# Patient Record
Sex: Male | Born: 1945 | Race: White | Hispanic: No | Smoking: Former smoker
Health system: Southern US, Community
[De-identification: ages and names within clinical notes are randomized; demographics above are authoritative.]

## PROBLEM LIST (undated history)

## (undated) DIAGNOSIS — F209 Schizophrenia, unspecified: Secondary | ICD-10-CM

## (undated) DIAGNOSIS — Z9989 Dependence on other enabling machines and devices: Secondary | ICD-10-CM

## (undated) DIAGNOSIS — F32A Depression, unspecified: Secondary | ICD-10-CM

## (undated) DIAGNOSIS — I10 Essential (primary) hypertension: Secondary | ICD-10-CM

## (undated) DIAGNOSIS — I2721 Secondary pulmonary arterial hypertension: Secondary | ICD-10-CM

## (undated) DIAGNOSIS — F329 Major depressive disorder, single episode, unspecified: Secondary | ICD-10-CM

## (undated) DIAGNOSIS — Z8711 Personal history of peptic ulcer disease: Secondary | ICD-10-CM

## (undated) DIAGNOSIS — I5020 Unspecified systolic (congestive) heart failure: Secondary | ICD-10-CM

## (undated) DIAGNOSIS — J189 Pneumonia, unspecified organism: Secondary | ICD-10-CM

## (undated) DIAGNOSIS — J432 Centrilobular emphysema: Secondary | ICD-10-CM

## (undated) DIAGNOSIS — IMO0002 Reserved for concepts with insufficient information to code with codable children: Secondary | ICD-10-CM

## (undated) DIAGNOSIS — I255 Ischemic cardiomyopathy: Secondary | ICD-10-CM

## (undated) DIAGNOSIS — I2510 Atherosclerotic heart disease of native coronary artery without angina pectoris: Secondary | ICD-10-CM

## (undated) DIAGNOSIS — G4733 Obstructive sleep apnea (adult) (pediatric): Secondary | ICD-10-CM

## (undated) DIAGNOSIS — E7800 Pure hypercholesterolemia, unspecified: Secondary | ICD-10-CM

## (undated) DIAGNOSIS — J449 Chronic obstructive pulmonary disease, unspecified: Secondary | ICD-10-CM

## (undated) DIAGNOSIS — I700 Atherosclerosis of aorta: Secondary | ICD-10-CM

## (undated) DIAGNOSIS — F419 Anxiety disorder, unspecified: Secondary | ICD-10-CM

## (undated) DIAGNOSIS — Z8719 Personal history of other diseases of the digestive system: Secondary | ICD-10-CM

## (undated) DIAGNOSIS — K219 Gastro-esophageal reflux disease without esophagitis: Secondary | ICD-10-CM

## (undated) DIAGNOSIS — Z9861 Coronary angioplasty status: Principal | ICD-10-CM

## (undated) DIAGNOSIS — F319 Bipolar disorder, unspecified: Secondary | ICD-10-CM

## (undated) DIAGNOSIS — S06369A Traumatic hemorrhage of cerebrum, unspecified, with loss of consciousness of unspecified duration, initial encounter: Secondary | ICD-10-CM

## (undated) HISTORY — PX: HERNIA REPAIR: SHX51

## (undated) HISTORY — PX: CARDIAC CATHETERIZATION: SHX172

## (undated) HISTORY — DX: Dependence on other enabling machines and devices: Z99.89

## (undated) HISTORY — DX: Obstructive sleep apnea (adult) (pediatric): G47.33

## (undated) HISTORY — PX: APPENDECTOMY: SHX54

## (undated) HISTORY — DX: Atherosclerosis of aorta: I70.0

## (undated) HISTORY — DX: Coronary angioplasty status: Z98.61

## (undated) HISTORY — PX: HIATAL HERNIA REPAIR: SHX195

## (undated) HISTORY — DX: Atherosclerotic heart disease of native coronary artery without angina pectoris: I25.10

## (undated) HISTORY — DX: Centrilobular emphysema: J43.2

## (undated) HISTORY — DX: Chronic obstructive pulmonary disease, unspecified: J44.9

## (undated) HISTORY — DX: Traumatic hemorrhage of cerebrum, unspecified, with loss of consciousness of unspecified duration, initial encounter: S06.369A

## (undated) HISTORY — DX: Reserved for concepts with insufficient information to code with codable children: IMO0002

---

## 2015-03-23 DIAGNOSIS — S0636AA Traumatic hemorrhage of cerebrum, unspecified, with loss of consciousness status unknown, initial encounter: Secondary | ICD-10-CM

## 2015-03-23 DIAGNOSIS — S06369A Traumatic hemorrhage of cerebrum, unspecified, with loss of consciousness of unspecified duration, initial encounter: Secondary | ICD-10-CM

## 2015-03-23 HISTORY — DX: Traumatic hemorrhage of cerebrum, unspecified, with loss of consciousness status unknown, initial encounter: S06.36AA

## 2015-03-23 HISTORY — DX: Traumatic hemorrhage of cerebrum, unspecified, with loss of consciousness of unspecified duration, initial encounter: S06.369A

## 2015-10-11 ENCOUNTER — Ambulatory Visit: Payer: Self-pay | Admitting: Family Medicine

## 2015-10-12 ENCOUNTER — Ambulatory Visit (INDEPENDENT_AMBULATORY_CARE_PROVIDER_SITE_OTHER): Payer: Medicare (Managed Care) | Admitting: Physician Assistant

## 2015-10-12 VITALS — BP 124/63 | HR 73 | Ht 71.0 in | Wt 281.0 lb

## 2015-10-12 DIAGNOSIS — J4530 Mild persistent asthma, uncomplicated: Secondary | ICD-10-CM

## 2015-10-12 DIAGNOSIS — E669 Obesity, unspecified: Secondary | ICD-10-CM | POA: Diagnosis not present

## 2015-10-12 DIAGNOSIS — M7989 Other specified soft tissue disorders: Secondary | ICD-10-CM | POA: Diagnosis not present

## 2015-10-12 DIAGNOSIS — G479 Sleep disorder, unspecified: Secondary | ICD-10-CM

## 2015-10-12 DIAGNOSIS — F251 Schizoaffective disorder, depressive type: Secondary | ICD-10-CM | POA: Diagnosis not present

## 2015-10-12 DIAGNOSIS — I2510 Atherosclerotic heart disease of native coronary artery without angina pectoris: Secondary | ICD-10-CM

## 2015-10-12 DIAGNOSIS — R0683 Snoring: Secondary | ICD-10-CM

## 2015-10-12 DIAGNOSIS — R0600 Dyspnea, unspecified: Secondary | ICD-10-CM

## 2015-10-12 MED ORDER — ALBUTEROL SULFATE HFA 108 (90 BASE) MCG/ACT IN AERS: 2.0000 | INHALATION_SPRAY | Freq: Four times a day (QID) | RESPIRATORY_TRACT | 2 refills | Status: AC | PRN

## 2015-10-12 MED ORDER — FLUTICASONE FUROATE-VILANTEROL 100-25 MCG/INH IN AEPB: 1.0000 | INHALATION_SPRAY | Freq: Every day | RESPIRATORY_TRACT | 5 refills | Status: DC

## 2015-10-12 NOTE — Progress Notes (Signed)
   Subjective:    Patient ID: Ryan Berg, male    DOB: 04/03/1945, 70 y.o.   MRN: [id number]  HPI  Pt is a 70 yo male new patient with schizoaffective disorder and asthma who presents to the clinic to establish care. He just moved here to live with sister from [place]. He just [facility] where all his meds were changed. We don't have any notes to know what was done. He is having problems snoring and feeling rested. He complains of some shortness of breath more with exertion but sometimes even at rest. He admits he has had some recent wheezing as well and does not have a rescue inhaler for asthma. He is taking singulair but not on any daily inhaler.   .. Active Ambulatory Problems    Diagnosis Date Noted  . Schizoaffective disorder, depressive type (HCC) 10/12/2015  . Swelling of limb 10/14/2015  . Asthma, mild persistent 10/14/2015  . Snoring 10/14/2015  . Obesity 10/14/2015  . Sleeping difficulties 10/14/2015  . Coronary artery disease involving native coronary artery of native heart without angina pectoris 10/14/2015   Resolved Ambulatory Problems    Diagnosis Date Noted  . No Resolved Ambulatory Problems   No Additional Past Medical History   .Marland Kitchen. Social History   Social History  . Marital status: Unknown    Spouse name: N/A  . Number of children: N/A  . Years of education: N/A   Occupational History  . Not on file.   Social History Main Topics  . Smoking status: Never Smoker  . Smokeless tobacco: Not on file  . Alcohol use Not on file  . Drug use: Unknown  . Sexual activity: Not on file   Other Topics Concern  . Not on file   Social History Narrative  . No narrative on file      Review of Systems See HPI>     Objective:   Physical Exam  Constitutional: He is oriented to person, place, and time. He appears well-developed and well-nourished.  Obese.   HENT:  Head: Normocephalic and atraumatic.  Cardiovascular: Normal rate,  regular rhythm and normal heart sounds.   Pulmonary/Chest: Effort normal. He has wheezes.  Scattered expiratory wheezing of both lungs.   Neurological: He is alert and oriented to person, place, and time.  Skin: Skin is dry.  Diffuse mild swelling in bilateral hands and fingers.  1+ pitting edema in bilateral ankles.   Psychiatric: He has a normal mood and affect. His behavior is normal.          Assessment & Plan:  [facility]   Snoring/sleeping difficulties/obesity- will order sleep study. Continue on remeron.  Recent weight gain medication side effect vs water retention.   Swelling of extremities- will check sodium since hx of sodium tablets. Will consider a diuretic if sodium and potassium normal. Certainly need to consider low salt diet and if there could be any medication reaction.  Will order echo to look at heart and rule out CHF especially with CAD risk factors.   CAD-discussed with patient to go back on plavix. Continue on BB.   Asthma- continue on singulair, albuterol given for rescue, BREO started. Discussed side effects. Discussed how to use. FOLLow up in 1 month.   Spent 30 minutes going over medications that he brings in.

## 2015-10-12 NOTE — Patient Instructions (Addendum)
Follow up after sleep study?

## 2015-10-13 LAB — BASIC METABOLIC PANEL WITH GFR
BUN: 17 mg/dL (ref 7–25)
CHLORIDE: 104 mmol/L (ref 98–110)
CO2: 24 mmol/L (ref 20–31)
Calcium: 8.8 mg/dL (ref 8.6–10.3)
Creat: 0.74 mg/dL (ref 0.70–1.25)
Glucose, Bld: 105 mg/dL — ABNORMAL HIGH (ref 65–99)
POTASSIUM: 4.4 mmol/L (ref 3.5–5.3)
Sodium: 139 mmol/L (ref 135–146)

## 2015-10-14 ENCOUNTER — Encounter: Payer: Self-pay | Admitting: Physician Assistant

## 2015-10-14 DIAGNOSIS — M7989 Other specified soft tissue disorders: Secondary | ICD-10-CM | POA: Insufficient documentation

## 2015-10-14 DIAGNOSIS — R0683 Snoring: Secondary | ICD-10-CM | POA: Insufficient documentation

## 2015-10-14 DIAGNOSIS — I2510 Atherosclerotic heart disease of native coronary artery without angina pectoris: Secondary | ICD-10-CM | POA: Insufficient documentation

## 2015-10-14 DIAGNOSIS — G479 Sleep disorder, unspecified: Secondary | ICD-10-CM | POA: Insufficient documentation

## 2015-10-14 DIAGNOSIS — J4530 Mild persistent asthma, uncomplicated: Secondary | ICD-10-CM | POA: Insufficient documentation

## 2015-10-14 DIAGNOSIS — E669 Obesity, unspecified: Secondary | ICD-10-CM | POA: Insufficient documentation

## 2015-10-14 MED ORDER — HYDROCHLOROTHIAZIDE 12.5 MG PO TABS: 12.5000 mg | ORAL_TABLET | Freq: Every day | ORAL | 3 refills | Status: DC

## 2015-10-16 ENCOUNTER — Encounter: Payer: Self-pay | Admitting: Physician Assistant

## 2015-10-17 DIAGNOSIS — R0600 Dyspnea, unspecified: Secondary | ICD-10-CM | POA: Insufficient documentation

## 2015-10-17 NOTE — Progress Notes (Signed)
HCTZ sent. Echo ordered.

## 2015-10-18 ENCOUNTER — Ambulatory Visit: Payer: Self-pay | Admitting: Family Medicine

## 2015-10-19 ENCOUNTER — Other Ambulatory Visit: Payer: Self-pay | Admitting: *Deleted

## 2015-10-19 MED ORDER — METOPROLOL TARTRATE 25 MG PO TABS: 25.0000 mg | ORAL_TABLET | Freq: Two times a day (BID) | ORAL | 3 refills | Status: DC

## 2015-10-20 ENCOUNTER — Telehealth: Payer: Self-pay

## 2015-10-20 NOTE — Telephone Encounter (Signed)
Ryan Berg is taking the metoprolol 25 mg 1/2 tablet bid. Patient advised to continue taking as he has in the past.

## 2015-11-04 ENCOUNTER — Other Ambulatory Visit: Payer: Self-pay | Admitting: *Deleted

## 2015-11-04 MED ORDER — MONTELUKAST SODIUM 10 MG PO TABS: 10.0000 mg | ORAL_TABLET | Freq: Every day | ORAL | 5 refills | Status: DC

## 2015-11-07 ENCOUNTER — Other Ambulatory Visit: Payer: Self-pay | Admitting: *Deleted

## 2015-11-07 MED ORDER — ATORVASTATIN CALCIUM 10 MG PO TABS: 10.0000 mg | ORAL_TABLET | Freq: Every day | ORAL | 3 refills | Status: DC

## 2015-11-09 DIAGNOSIS — F251 Schizoaffective disorder, depressive type: Secondary | ICD-10-CM | POA: Diagnosis not present

## 2015-11-10 ENCOUNTER — Ambulatory Visit (HOSPITAL_BASED_OUTPATIENT_CLINIC_OR_DEPARTMENT_OTHER): Payer: PPO | Attending: Physician Assistant | Admitting: Internal Medicine

## 2015-11-10 VITALS — Ht 71.0 in | Wt 281.0 lb

## 2015-11-10 DIAGNOSIS — G479 Sleep disorder, unspecified: Secondary | ICD-10-CM | POA: Insufficient documentation

## 2015-11-10 DIAGNOSIS — G4733 Obstructive sleep apnea (adult) (pediatric): Secondary | ICD-10-CM | POA: Diagnosis not present

## 2015-11-10 DIAGNOSIS — E669 Obesity, unspecified: Secondary | ICD-10-CM | POA: Insufficient documentation

## 2015-11-10 DIAGNOSIS — R0683 Snoring: Secondary | ICD-10-CM | POA: Insufficient documentation

## 2015-11-13 DIAGNOSIS — G4733 Obstructive sleep apnea (adult) (pediatric): Secondary | ICD-10-CM | POA: Diagnosis not present

## 2015-11-13 NOTE — Procedures (Signed)
Patient Name: Ryan Berg, Ryan Berg Date: 11/10/2015 Gender: Male D.O.B: 1945-05-09 Age (years): 70 Referring Provider: Iran Planas Height (inches): 71 Interpreting Physician: Baird Lyons MD, ABSM Weight (lbs): 281 RPSGT: Baxter Flattery BMI: 69 MRN: [id number] Neck Size: 22.00 CLINICAL INFORMATION Sleep Study Type: Split Night CPAP Indication for sleep study: COPD, Fatigue, Hypertension, Obesity, OSA, Snoring, Witnessed Apneas Epworth Sleepiness Score: 10  SLEEP STUDY TECHNIQUE As per the AASM Manual for the Scoring of Sleep and Associated Events v2.3 (April 2016) with a hypopnea requiring 4% desaturations. The channels recorded and monitored were frontal, central and occipital EEG, electrooculogram (EOG), submentalis EMG (chin), nasal and oral airflow, thoracic and abdominal wall motion, anterior tibialis EMG, snore microphone, electrocardiogram, and pulse oximetry. Continuous positive airway pressure (CPAP) was initiated when the patient met split night criteria and was titrated according to treat sleep-disordered breathing.  MEDICATIONS Medications self-administered by patient taken the night of the study : HALDOL, DEPAKOTE  RESPIRATORY PARAMETERS Diagnostic Total AHI (/hr): 89.1 RDI (/hr): 90.6 OA Index (/hr): 42.8 CA Index (/hr): 0.0 REM AHI (/hr): N/A NREM AHI (/hr): 89.1 Supine AHI (/hr): 89.1 Non-supine AHI (/hr): N/A Min O2 Sat (%): 60.00 Mean O2 (%): 79.92 Time below 88% (min): 154.4   Titration Optimal Pressure (cm): 19 AHI at Optimal Pressure (/hr): 9.7 Min O2 at Optimal Pressure (%): 88.0 Supine % at Optimal (%): 100 Sleep % at Optimal (%): 100    SLEEP ARCHITECTURE The recording time for the entire night was 377.6 minutes. During a baseline period of 162.3 minutes, the patient slept for 156.9 minutes in REM and nonREM, yielding a sleep efficiency of 96.7%. Sleep onset after lights out was 0.8 minutes with a REM latency of N/A minutes. The patient spent 18.48% of  the night in stage N1 sleep, 81.52% in stage N2 sleep, 0.00% in stage N3 and 0.00% in REM. During the titration period of 212.0 minutes, the patient slept for 205.5 minutes in REM and nonREM, yielding a sleep efficiency of 96.9%. Sleep onset after CPAP initiation was 0.0 minutes with a REM latency of 29.8 minutes. The patient spent 4.28% of the night in stage N1 sleep, 43.66% in stage N2 sleep, 0.00% in stage N3 and 52.06% in REM.  CARDIAC DATA The 2 lead EKG demonstrated sinus rhythm. The mean heart rate was 76.47 beats per minute. Other EKG findings include: None.  LEG MOVEMENT DATA The total Periodic Limb Movements of Sleep (PLMS) were 0. The PLMS index was 0.00 .  IMPRESSIONS - Severe obstructive sleep apnea occurred during the diagnostic portion of the study (AHI = 89.1/hour). An optimal PAP pressure was selected for this patient ( 19 cm of water) - No significant central sleep apnea occurred during the diagnostic portion of the study (CAI = 0.0/hour). - Severe oxygen desaturation was noted during the diagnostic portion of the study (Min O2 = 60.00%). - Supplemental O2 added- 3L/min, per protocol for recurrent hypoxemia prior to CPAP control. - The patient snored with Soft snoring volume during the diagnostic portion of the study. - No cardiac abnormalities were noted during this study. - Clinically significant periodic limb movements did not occur during sleep.  DIAGNOSIS - Obstructive Sleep Apnea (327.23 [G47.33 ICD-10])  RECOMMENDATIONS - Trial of CPAP therapy on 19 cm H2O with a Large size Resmed Full Face Mask AirFit F20 mask and heated humidification. - Avoid alcohol, sedatives and other CNS depressants that may worsen sleep apnea and disrupt normal sleep architecture. - Sleep hygiene should be reviewed to  assess factors that may improve sleep quality. - Weight management and regular exercise should be initiated or continued. - Consider reassessing sleep O2 sat once established  with home CPAP.  [Electronically signed] 11/13/2015 01:27 PM  Baird Lyons MD, [place], American Board of Sleep Medicine   NPI: [id number]  [place], American Board of Sleep Medicine  ELECTRONICALLY SIGNED ON:  11/13/2015, 1:24 PM [place] PH: [phone]   FX: [phone] [place]

## 2015-11-14 ENCOUNTER — Encounter: Payer: Self-pay | Admitting: Physician Assistant

## 2015-11-14 DIAGNOSIS — G4733 Obstructive sleep apnea (adult) (pediatric): Secondary | ICD-10-CM | POA: Insufficient documentation

## 2015-11-15 ENCOUNTER — Other Ambulatory Visit: Payer: Self-pay | Admitting: *Deleted

## 2015-11-15 MED ORDER — AMBULATORY NON FORMULARY MEDICATION: 0 refills | Status: DC

## 2015-11-22 ENCOUNTER — Ambulatory Visit (INDEPENDENT_AMBULATORY_CARE_PROVIDER_SITE_OTHER): Payer: PPO

## 2015-11-22 ENCOUNTER — Ambulatory Visit (INDEPENDENT_AMBULATORY_CARE_PROVIDER_SITE_OTHER): Payer: PPO | Admitting: Sports Medicine

## 2015-11-22 ENCOUNTER — Encounter: Payer: Self-pay | Admitting: Sports Medicine

## 2015-11-22 ENCOUNTER — Emergency Department (HOSPITAL_COMMUNITY)
Admission: EM | Admit: 2015-11-22 | Discharge: 2015-11-22 | Disposition: A | Discharge status: Home / Self Care | Payer: PPO | Attending: Emergency Medicine | Admitting: Emergency Medicine

## 2015-11-22 ENCOUNTER — Encounter (HOSPITAL_COMMUNITY): Payer: Self-pay | Admitting: Emergency Medicine

## 2015-11-22 DIAGNOSIS — I2510 Atherosclerotic heart disease of native coronary artery without angina pectoris: Secondary | ICD-10-CM | POA: Insufficient documentation

## 2015-11-22 DIAGNOSIS — R1032 Left lower quadrant pain: Secondary | ICD-10-CM

## 2015-11-22 DIAGNOSIS — J4530 Mild persistent asthma, uncomplicated: Secondary | ICD-10-CM | POA: Diagnosis not present

## 2015-11-22 DIAGNOSIS — K5730 Diverticulosis of large intestine without perforation or abscess without bleeding: Secondary | ICD-10-CM | POA: Diagnosis not present

## 2015-11-22 DIAGNOSIS — R1030 Lower abdominal pain, unspecified: Secondary | ICD-10-CM | POA: Diagnosis not present

## 2015-11-22 DIAGNOSIS — Z7902 Long term (current) use of antithrombotics/antiplatelets: Secondary | ICD-10-CM | POA: Diagnosis not present

## 2015-11-22 DIAGNOSIS — Z79899 Other long term (current) drug therapy: Secondary | ICD-10-CM | POA: Insufficient documentation

## 2015-11-22 DIAGNOSIS — R1084 Generalized abdominal pain: Secondary | ICD-10-CM

## 2015-11-22 DIAGNOSIS — K5900 Constipation, unspecified: Secondary | ICD-10-CM

## 2015-11-22 DIAGNOSIS — K5909 Other constipation: Secondary | ICD-10-CM

## 2015-11-22 HISTORY — DX: Bipolar disorder, unspecified: F31.9

## 2015-11-22 LAB — CBC
HEMATOCRIT: 47.1 % (ref 39.0–52.0)
HEMOGLOBIN: 16.2 g/dL (ref 13.0–17.0)
MCH: 35.8 pg — ABNORMAL HIGH (ref 26.0–34.0)
MCHC: 34.4 g/dL (ref 30.0–36.0)
MCV: 104 fL — ABNORMAL HIGH (ref 78.0–100.0)
Platelets: 217 10*3/uL (ref 150–400)
RBC: 4.53 MIL/uL (ref 4.22–5.81)
RDW: 13.1 % (ref 11.5–15.5)
WBC: 7.5 10*3/uL (ref 4.0–10.5)

## 2015-11-22 LAB — COMPREHENSIVE METABOLIC PANEL
ALBUMIN: 4 g/dL (ref 3.5–5.0)
ALT: 26 U/L (ref 17–63)
ANION GAP: 10 (ref 5–15)
AST: 34 U/L (ref 15–41)
Alkaline Phosphatase: 50 U/L (ref 38–126)
BILIRUBIN TOTAL: 0.9 mg/dL (ref 0.3–1.2)
BUN: 20 mg/dL (ref 6–20)
CO2: 26 mmol/L (ref 22–32)
Calcium: 9.4 mg/dL (ref 8.9–10.3)
Chloride: 101 mmol/L (ref 101–111)
Creatinine, Ser: 0.9 mg/dL (ref 0.61–1.24)
GFR calc Af Amer: >60 mL/min (ref >60)
GFR calc non Af Amer: >60 mL/min (ref >60)
GLUCOSE: 155 mg/dL — AB (ref 65–99)
POTASSIUM: 4 mmol/L (ref 3.5–5.1)
SODIUM: 137 mmol/L (ref 135–145)
TOTAL PROTEIN: 6.8 g/dL (ref 6.5–8.1)

## 2015-11-22 LAB — LIPASE, BLOOD: LIPASE: 32 U/L (ref 11–51)

## 2015-11-22 LAB — URINALYSIS, ROUTINE W REFLEX MICROSCOPIC
BILIRUBIN URINE: NEGATIVE
Glucose, UA: NEGATIVE mg/dL
Hgb urine dipstick: NEGATIVE
Ketones, ur: NEGATIVE mg/dL
LEUKOCYTES UA: NEGATIVE
NITRITE: NEGATIVE
PH: 6 (ref 5.0–8.0)
Protein, ur: NEGATIVE mg/dL
SPECIFIC GRAVITY, URINE: 1.023 (ref 1.005–1.030)

## 2015-11-22 MED ORDER — DICYCLOMINE HCL 10 MG/ML IM SOLN
20.0000 mg | Freq: Once | INTRAMUSCULAR | Status: AC
  Administered 2015-11-22: 20 mg via INTRAMUSCULAR
  Filled 2015-11-22: qty 2

## 2015-11-22 MED ORDER — SENNOSIDES-DOCUSATE SODIUM 8.6-50 MG PO TABS: 2.0000 | ORAL_TABLET | Freq: Two times a day (BID) | ORAL | 0 refills | Status: DC

## 2015-11-22 MED ORDER — POLYETHYLENE GLYCOL 3350 17 G PO PACK: 17.0000 g | PACK | Freq: Two times a day (BID) | ORAL | 11 refills | Status: DC

## 2015-11-22 NOTE — ED Triage Notes (Signed)
Pt was seen today at med center, had a CT scan done which showed possible sigmoid volvulus, sent here for further eval and poss surgery. Pt denies pain at this time.

## 2015-11-22 NOTE — ED Provider Notes (Signed)
[facility]-EMERGENCY DEPT Provider Note   CSN: [id number] Arrival date & time: 11/22/15  1248     History   Chief Complaint Chief Complaint  Patient presents with  . Abdominal Pain    HPI Ryan Berg Noe is a 70 y.o. male.  Patient presents referred by PCP with concern for possible sigmoid volvulus. He states he has had intermittent abdominal cramps in the lower abdomen for the last 4-5 days. No exacerbating or alleviating factors. He denies fever or vomiting. Previously has had an appendectomy and ventral hernia repair, no recent surgeries. Has had issues with constipation and hasnot had a bowel movement in two days.   The history is provided by the patient, medical records and a relative. No language interpreter was used.  Abdominal Cramping  This is a new problem. The current episode started more than 2 days ago. The problem occurs daily. The problem has not changed since onset.Associated symptoms include abdominal pain. Pertinent negatives include no chest pain and no shortness of breath. Nothing aggravates the symptoms. Nothing relieves the symptoms. Treatments tried: Stool softener. The treatment provided no relief.    Past Medical History:  Diagnosis Date  . Bipolar affective [facility](HCC)     Patient Active Problem List   Diagnosis Date Noted  . Acute abdominal pain in left lower quadrant 11/22/2015  . Severe obstructive sleep apnea 11/14/2015  . Dyspnea 10/17/2015  . Swelling of limb 10/14/2015  . Asthma, mild persistent 10/14/2015  . Snoring 10/14/2015  . Obesity 10/14/2015  . Sleeping difficulties 10/14/2015  . Coronary artery disease involving native coronary artery of native heart without angina pectoris 10/14/2015  . Schizoaffective disorder, depressive type (HCC) 10/12/2015    Past Surgical History:  Procedure Laterality Date  . APPENDECTOMY    . HERNIA REPAIR         Home Medications    Prior to Admission medications   Medication Sig Start Date End Date  Taking? Authorizing Provider  albuterol (PROVENTIL HFA;VENTOLIN HFA) 108 (90 Base) MCG/ACT inhaler Inhale 2 puffs into the lungs every 6 (six) hours as needed for wheezing or shortness of breath. 10/12/15   Jade L Breeback, PA-C  AMBULATORY NON FORMULARY MEDICATION CPAP and supplies set on 19 cm H2O with a Large size Resmed  Full Face Mask AirFit F20 mask and heated humidification 11/15/15   Jade L Breeback, PA-C  atorvastatin (LIPITOR) 10 MG tablet Take 1 tablet (10 mg total) by mouth daily at 6 PM. 11/07/15   Jade L Breeback, PA-C  clopidogrel (PLAVIX) 75 MG tablet Take by mouth.    Historical Provider, MD  divalproex (DEPAKOTE) 250 MG DR tablet Take by mouth. 09/19/15 09/18/16  Historical Provider, MD  divalproex (DEPAKOTE) 500 MG DR tablet Take by mouth. 09/19/15   Historical Provider, MD  fluticasone (FLONASE) 50 MCG/ACT nasal spray Place into the nose.    Historical Provider, MD  fluticasone furoate-vilanterol (BREO ELLIPTA) 100-25 MCG/INH AEPB Inhale 1 puff into the lungs daily. 10/12/15   Jomarie LongsJade L Breeback, PA-C  gabapentin (NEURONTIN) 100 MG capsule Take by mouth. 09/19/15 09/18/16  Historical Provider, MD  haloperidol (HALDOL) 10 MG tablet Take by mouth. 09/19/15 10/19/15  Historical Provider, MD  haloperidol decanoate (HALDOL DECANOATE) 50 MG/ML injection Inject into the muscle. 09/19/15   Historical Provider, MD  hydrochlorothiazide (HYDRODIURIL) 12.5 MG tablet Take 1 tablet (12.5 mg total) by mouth daily. Or as needed for swelling. 10/14/15   Jade L Breeback, PA-C  metoprolol tartrate (LOPRESSOR) 25 MG tablet Take 1  tablet (25 mg total) by mouth 2 (two) times daily. Patient taking differently: Take 12.5 mg by mouth 2 (two) times daily.  10/19/15 10/18/16  Jomarie LongsJade L Breeback, PA-C  mirtazapine (REMERON) 30 MG tablet Take by mouth. 09/19/15 10/19/15  Historical Provider, MD  montelukast (SINGULAIR) 10 MG tablet Take 1 tablet (10 mg total) by mouth at bedtime. 11/04/15   Jade L Breeback, PA-C  polyethylene  glycol (MIRALAX / GLYCOLAX) packet Take 17 g by mouth 2 (two) times daily. Until stooling regularly 11/22/15   Monica Bectonhomas J Thekkekandam, MD  senna-docusate (SENOKOT-S) 8.6-50 MG tablet Take 2 tablets by mouth 2 (two) times daily. Until stooling regularly 11/22/15   Monica Bectonhomas J Thekkekandam, MD    Family History No family history on file.  Social History Social History  Substance Use Topics  . Smoking status: Never Smoker  . Smokeless tobacco: Not on file  . Alcohol use No     Allergies   Review of patient's allergies indicates no known allergies.   Review of Systems Review of Systems  Constitutional: Negative for fever.  HENT: Negative.   Respiratory: Negative for shortness of breath.   Cardiovascular: Negative for chest pain.  Gastrointestinal: Positive for abdominal pain and constipation. Negative for diarrhea and vomiting.  Genitourinary: Negative.   Musculoskeletal: Negative.   Skin: Negative.   Allergic/Immunologic: Negative for immunocompromised state.  Neurological: Negative.   Hematological: Does not bruise/bleed easily.  Psychiatric/Behavioral: Negative.      Physical Exam Updated Vital Signs BP 118/95   Pulse 90   Temp 98.3 F (36.8 C) (Oral)   Resp 18   SpO2 96%   Physical Exam  Constitutional: He is oriented to person, place, and time. He appears well-developed and well-nourished. No distress.  HENT:  Head: Normocephalic and atraumatic.  Eyes: Conjunctivae and EOM are normal. No scleral icterus.  Neck: Normal range of motion. Neck supple.  Cardiovascular: Normal rate, regular rhythm and normal heart sounds.  Exam reveals no gallop and no friction rub.   No murmur heard. Pulmonary/Chest: Effort normal and breath sounds normal. No respiratory distress. He has no wheezes. He has no rales.  Abdominal: Soft. Bowel sounds are normal. There is no tenderness. There is no rigidity, no rebound, no guarding, no tenderness at McBurney's point and negative Murphy's  sign.  Neurological: He is alert and oriented to person, place, and time.  Skin: Skin is warm and dry. He is not diaphoretic.  Psychiatric: He has a normal mood and affect. His behavior is normal. Judgment and thought content normal.     ED Treatments / Results  Labs (all labs ordered are listed, but only abnormal results are displayed) Labs Reviewed  COMPREHENSIVE METABOLIC PANEL - Abnormal; Notable for the following:       Result Value   Glucose, Bld 155 (*)    All other components within normal limits  CBC - Abnormal; Notable for the following:    MCV 104.0 (*)    MCH 35.8 (*)    All other components within normal limits  URINALYSIS, ROUTINE W REFLEX MICROSCOPIC (NOT AT [facility]) - Abnormal; Notable for the following:    Color, Urine AMBER (*)    All other components within normal limits  LIPASE, BLOOD    EKG  EKG Interpretation None       Radiology Ct Abdomen Pelvis Wo Contrast  Result Date: 11/22/2015 CLINICAL DATA:  Constipation for 10 days. Left lower quadrant abdominal pain. EXAM: CT ABDOMEN AND PELVIS WITHOUT CONTRAST TECHNIQUE:  Multidetector CT imaging of the abdomen and pelvis was performed following the standard protocol without IV contrast. COMPARISON:  None. FINDINGS: Lower chest: Diffuse coronary artery calcifications. No confluent airspace opacities in the lower lobes. No effusions. Hepatobiliary: Small hypodensity centrally within the liver, likely small cyst. Gallbladder unremarkable. No biliary duct dilatation. Pancreas: No focal abnormality or ductal dilatation. Spleen: Scattered calcifications in the spleen compatible with old granulomatous disease. Adrenals/Urinary Tract: No adrenal abnormality. No focal renal abnormality. No stones or hydronephrosis. Urinary bladder is unremarkable. Stomach/Bowel: There is descending colonic and sigmoid diverticulosis. There is mild gaseous distention of the sigmoid colon anteriorly. More distally, the sigmoid colon has a  somewhat beaked appearance, but there is no twisting to suggest torsion or convincing evidence of obstruction. Stomach and small bowel are decompressed, grossly unremarkable. Moderate stool burden in the colon. Vascular/Lymphatic: No evidence of aortic aneurysm. IVC filter is in place within the lower IVC. The filter legs appear to extend through the IVC wall. Reproductive: No visible focal abnormality. Other: No free fluid or free air. Musculoskeletal: No acute bony abnormality. Degenerative facet disease in the lower lumbar spine. IMPRESSION: No evidence of bowel obstruction. There is left colonic diverticulosis. No active diverticulitis. Mild gaseous distention of the sigmoid colon without evidence for obstruction. Coronary artery disease. Moderate stool in the colon. No acute findings in the abdomen or pelvis. Electronically Signed   By: Charlett Nose M.D.   On: 11/22/2015 12:12    Procedures Procedures (including critical care time)  Medications Ordered in ED Medications  dicyclomine (BENTYL) injection 20 mg (20 mg Intramuscular Given 11/22/15 1643)     Initial Impression / Assessment and Plan / ED Course  I have reviewed the triage vital signs and the nursing notes.  Pertinent labs & imaging results that were available during my care of the patient were reviewed by me and considered in my medical decision making (see chart for details).  Clinical Course    Patient presents from his PCP where he was referred for possible sigmoid volvulus. He initially saw PCP for intermittent abdominal cramping and constipation, no bowel movement for the last 2 days. CT scan was done which, on initial radiology read, felt possibly consistent with volvulus. On presentation he is well-appearing with normal vital signs. Minimal abdominal tenderness on examination with no signs of peritonitis. Labs including CBC and CMP, and Ua, are unremarkable. Looked at radiology read and this does not read concern for  volvulus. Discussed with radiologist who noted that on further review of the scan he did not see a volvulus and attempted for 2 hours to contact the PCP, but was unable to do so. Feel patient's symptoms consistent with constipation and gas pains. He already has stool softener and Miralax prescribed by PCP. He is in good condition for discharge home and continued PCP follow up.  Final Clinical Impressions(s) / ED Diagnoses   Final diagnoses:  Generalized abdominal pain  Other constipation    New Prescriptions Discharge Medication List as of 11/22/2015  4:34 PM       Preston Fleeting, MD 11/23/15 2120    Gwyneth Sprout, MD 11/23/15 2309

## 2015-11-22 NOTE — Progress Notes (Addendum)
  Subjective:    CC: Abdominal pain  HPI: This is a pleasant 70 year old male, he has known constipation, for the past 10 days he has not had a bowel movement and only minimal flatus, and has developed some moderate left lower quadrant abdominal pain. No fevers, chills, no hematochezia or melena. Symptoms are moderate, persistent. Currently only using Colace.  Past medical history:  Negative.  See flowsheet/record as well for more information.  Surgical history: Negative.  See flowsheet/record as well for more information.  Family history: Negative.  See flowsheet/record as well for more information.  Social history: Negative.  See flowsheet/record as well for more information.  Allergies, and medications have been entered into the medical record, reviewed, and no changes needed.   Review of Systems: No fevers, chills, night sweats, weight loss, chest pain, or shortness of breath.   Objective:    General: Well Developed, well nourished, and in no acute distress.  Neuro: Alert and oriented x3, extra-ocular muscles intact, sensation grossly intact.  HEENT: Normocephalic, atraumatic, pupils equal round reactive to light, neck supple, no masses, no lymphadenopathy, thyroid nonpalpable.  Skin: Warm and dry, no rashes. Cardiac: Regular rate and rhythm, no murmurs rubs or gallops, no lower extremity edema.  Respiratory: Clear to auscultation bilaterally. Not using accessory muscles, speaking in full sentences. Abdomen: Soft, tender to palpation left lower quadrant with mild guarding. Nondistended, normal bowel sounds, no palpable masses with the exception of a visible diastases recti.  Impression and Recommendations:    Acute abdominal pain in left lower quadrant Diverticulitis versus impaction. Patient declines contrast CT so we will simply do an oral contrast CT without IV. Adding MiraLAX and Senokot. Does get constipated often. If evidence of diverticulosis we will add Cipro and  Flagyl.

## 2015-11-22 NOTE — Assessment & Plan Note (Addendum)
Diverticulitis versus impaction. Patient declines contrast CT so we will simply do an oral contrast CT without IV. Adding MiraLAX and Senokot. Does get constipated often. If evidence of diverticulosis we will add Cipro and Flagyl.  CT scan concerning for sigmoid volvulus, discussed with radiology. I have advised [facility] department.

## 2015-11-22 NOTE — ED Notes (Signed)
EDP at bedside  

## 2015-12-01 ENCOUNTER — Encounter (HOSPITAL_BASED_OUTPATIENT_CLINIC_OR_DEPARTMENT_OTHER): Payer: Medicare (Managed Care)

## 2015-12-05 ENCOUNTER — Encounter: Payer: Self-pay | Admitting: Physician Assistant

## 2015-12-05 ENCOUNTER — Ambulatory Visit (INDEPENDENT_AMBULATORY_CARE_PROVIDER_SITE_OTHER): Payer: PPO | Admitting: Physician Assistant

## 2015-12-05 VITALS — BP 130/67 | HR 67 | Ht 71.0 in | Wt 292.0 lb

## 2015-12-05 DIAGNOSIS — K5900 Constipation, unspecified: Secondary | ICD-10-CM | POA: Diagnosis not present

## 2015-12-05 MED ORDER — DOCUSATE SODIUM 100 MG PO CAPS: 100.0000 mg | ORAL_CAPSULE | Freq: Two times a day (BID) | ORAL | 2 refills | Status: DC

## 2015-12-05 NOTE — Progress Notes (Signed)
   Subjective:    Patient ID: Ryan Berg, male    DOB: 10/28/1945, 70 y.o.   MRN: [id number]  HPI  Pt is a 70 yo male who presents to the clinic for hospital follow up for acute abdominal pain that he was seen in clinic on 11/22/15 and had CT suspicious for volvulus and sent to ED.   See Hospital discharge note.   Patient is a 70 year old male presenting today from PCP office for follow-up on a CT scan done today. Patient states he is feeling constipated for at least 2-3 days and he will get intermittent bowel cramps which double him over. He is not currently having abdominal pain. Patient was seen by PCP today and had a CT done which initially was read as concern for volvulus but on further evaluation by the radiologist CT was read as moderate stool burden but no sign of obstruction or volvulus. Patient's labs are within normal limits. Radiology attempted to contact his PCP for hours but whether unsuccessful. On exam patient is in no acute distress, heart with regular rate and rhythm, lungs are clear to auscultation bilaterally, abdomen with positive bowel sounds and no reproducible tenderness. Patient is morbidly obese. Full DC home with patient to start MiraLAX and other laxative given by PCP today.    Pt is feeling much better today. He is having daily bowel movements that are soft. He is not straining and no blood noted with wiping.   Review of Systems  All other systems reviewed and are negative.      Objective:   Physical Exam  Constitutional: He is oriented to person, place, and time. He appears well-developed and well-nourished.  HENT:  Head: Normocephalic and atraumatic.  Cardiovascular: Normal rate, regular rhythm and normal heart sounds.   Pulmonary/Chest: Effort normal and breath sounds normal. He has no wheezes.  Abdominal: Soft. Bowel sounds are normal. He exhibits no distension and no mass. There is no tenderness. There is no rebound and no guarding.  Neurological: He  is alert and oriented to person, place, and time.  Psychiatric: He has a normal mood and affect. His behavior is normal.          Assessment & Plan:  Marland Kitchen.Marland Kitchen.Diagnoses and all orders for this visit:  Constipation, unspecified constipation type -     docusate sodium (COLACE) 100 MG capsule; Take 1 capsule (100 mg total) by mouth 2 (two) times daily.   Discussed miralax and probiotic. Make sure staying hydrated. Cut down on colace if stools are becoming too loose. Follow up as needed.

## 2015-12-05 NOTE — Patient Instructions (Signed)
miralax 1/2 to 1 capful a day with juice/gaterade Consider yogurt with probiotics  Make sure drinking enough water.   Constipation, Adult Constipation is when a person has fewer than three bowel movements a week, has difficulty having a bowel movement, or has stools that are dry, hard, or larger than normal. As people grow older, constipation is more common. A low-fiber diet, not taking in enough fluids, and taking certain medicines may make constipation worse.  CAUSES   Certain medicines, such as antidepressants, pain medicine, iron supplements, antacids, and water pills.   Certain diseases, such as diabetes, irritable bowel syndrome (IBS), thyroid disease, or depression.   Not drinking enough water.   Not eating enough fiber-rich foods.   Stress or travel.   Lack of physical activity or exercise.   Ignoring the urge to have a bowel movement.   Using laxatives too much.  SIGNS AND SYMPTOMS   Having fewer than three bowel movements a week.   Straining to have a bowel movement.   Having stools that are hard, dry, or larger than normal.   Feeling full or bloated.   Pain in the lower abdomen.   Not feeling relief after having a bowel movement.  DIAGNOSIS  Your health care provider will take a medical history and perform a physical exam. Further testing may be done for severe constipation. Some tests may include:  A barium enema X-ray to examine your rectum, colon, and, sometimes, your small intestine.   A sigmoidoscopy to examine your lower colon.   A colonoscopy to examine your entire colon. TREATMENT  Treatment will depend on the severity of your constipation and what is causing it. Some dietary treatments include drinking more fluids and eating more fiber-rich foods. Lifestyle treatments may include regular exercise. If these diet and lifestyle recommendations do not help, your health care provider may recommend taking over-the-counter laxative medicines  to help you have bowel movements. Prescription medicines may be prescribed if over-the-counter medicines do not work.  HOME CARE INSTRUCTIONS   Eat foods that have a lot of fiber, such as fruits, vegetables, whole grains, and beans.  Limit foods high in fat and processed sugars, such as french fries, hamburgers, cookies, candies, and soda.   A fiber supplement may be added to your diet if you cannot get enough fiber from foods.   Drink enough fluids to keep your urine clear or pale yellow.   Exercise regularly or as directed by your health care provider.   Go to the restroom when you have the urge to go. Do not hold it.   Only take over-the-counter or prescription medicines as directed by your health care provider. Do not take other medicines for constipation without talking to your health care provider first.  SEEK IMMEDIATE MEDICAL CARE IF:   You have bright red blood in your stool.   Your constipation lasts for more than 4 days or gets worse.   You have abdominal or rectal pain.   You have thin, pencil-like stools.   You have unexplained weight loss. MAKE SURE YOU:   Understand these instructions.  Will watch your condition.  Will get help right away if you are not doing well or get worse.   This information is not intended to replace advice given to you by your health care provider. Make sure you discuss any questions you have with your health care provider.   Document Released: 10/07/2003 Document Revised: 01/29/2014 Document Reviewed: 10/20/2012 Elsevier Interactive Patient Education [employer]2016 [employer] Inc.

## 2015-12-09 ENCOUNTER — Other Ambulatory Visit: Payer: Self-pay | Admitting: Physician Assistant

## 2015-12-13 DIAGNOSIS — G4733 Obstructive sleep apnea (adult) (pediatric): Secondary | ICD-10-CM | POA: Diagnosis not present

## 2015-12-20 ENCOUNTER — Encounter: Payer: Self-pay | Admitting: Physician Assistant

## 2015-12-27 ENCOUNTER — Telehealth: Payer: Self-pay | Admitting: Physician Assistant

## 2015-12-27 NOTE — Telephone Encounter (Signed)
Patient requested a call back to discuss question about cpap. Thanks

## 2016-01-02 ENCOUNTER — Ambulatory Visit (INDEPENDENT_AMBULATORY_CARE_PROVIDER_SITE_OTHER): Payer: PPO | Admitting: Physician Assistant

## 2016-01-02 ENCOUNTER — Encounter: Payer: Self-pay | Admitting: Physician Assistant

## 2016-01-02 VITALS — BP 128/73 | HR 74 | Ht 71.0 in | Wt 313.0 lb

## 2016-01-02 DIAGNOSIS — T50905A Adverse effect of unspecified drugs, medicaments and biological substances, initial encounter: Secondary | ICD-10-CM | POA: Diagnosis not present

## 2016-01-02 DIAGNOSIS — R635 Abnormal weight gain: Secondary | ICD-10-CM

## 2016-01-02 DIAGNOSIS — G4733 Obstructive sleep apnea (adult) (pediatric): Secondary | ICD-10-CM

## 2016-01-02 MED ORDER — LORCASERIN HCL 10 MG PO TABS: 1.0000 | ORAL_TABLET | Freq: Two times a day (BID) | ORAL | 1 refills | Status: DC

## 2016-01-02 MED ORDER — AMBULATORY NON FORMULARY MEDICATION: 0 refills | Status: DC

## 2016-01-02 NOTE — Progress Notes (Signed)
   Subjective:    Patient ID: Ryan Berg, male    DOB: 07/29/1945, 70 y.o.   MRN: [id number]  HPI  Pt is a 70 yo male who presents to the clinic with his sister to get new prescription for CPAP. New settings need to be on auto tiration and they need new order. He has not been able to consisently wear CPAP due to mask and setting difficultly. The hope is to get this worked out in the near future.   Pt continues to gain weight since being on remeron. He was started in September and patients weight started at 282. He is now 313. He has gained 20 of the lbs in the last month. [facility] He does admit to eating and drinking a lot at times. He has been known to drink a whole gallon of milk a day. He admits to drinking a 12 pack of beer every 1-2 weeks. He is not exercising.    Review of Systems See HPI.     Objective:   Physical Exam  Constitutional: He is oriented to person, place, and time. He appears well-developed and well-nourished.  Morbidly obese.   HENT:  Head: Normocephalic and atraumatic.  Cardiovascular: Normal rate, regular rhythm and normal heart sounds.   Pulmonary/Chest: Effort normal and breath sounds normal.  Neurological: He is alert and oriented to person, place, and time.  Psychiatric: He has a normal mood and affect. His behavior is normal.          Assessment & Plan:  Marland Kitchen.Marland Kitchen.Diagnoses and all orders for this visit:  Severe obstructive sleep apnea -     AMBULATORY NON FORMULARY MEDICATION; CPAP and supplies set on auto range.  Abnormal weight gain -     Lorcaserin HCl (BELVIQ) 10 MG TABS; Take 1 tablet by mouth 2 (two) times daily.  Weight gain due to medication -     Lorcaserin HCl (BELVIQ) 10 MG TABS; Take 1 tablet by mouth 2 (two) times daily.   Discussed 150 minutes of exercise and 1500 calorie diet. Pt is not motivated. Discussed food restriction from sister who buys all the food. For example only leaving a quart of milk a day at the  house.  Pt is not a candidate for phentermine. beliviq sent to pharmacy. Discussed how to take and how medication works.SE's discussed as well.  I do think remeron has something to do with weight gain. Discussed to cut in halft and follow up with [facility].

## 2016-01-12 DIAGNOSIS — G4733 Obstructive sleep apnea (adult) (pediatric): Secondary | ICD-10-CM | POA: Diagnosis not present

## 2016-01-24 ENCOUNTER — Other Ambulatory Visit: Payer: Self-pay | Admitting: Physician Assistant

## 2016-02-07 DIAGNOSIS — F251 Schizoaffective disorder, depressive type: Secondary | ICD-10-CM | POA: Diagnosis not present

## 2016-02-10 ENCOUNTER — Other Ambulatory Visit: Payer: Self-pay | Admitting: Physician Assistant

## 2016-02-10 DIAGNOSIS — K5900 Constipation, unspecified: Secondary | ICD-10-CM

## 2016-02-10 DIAGNOSIS — F251 Schizoaffective disorder, depressive type: Secondary | ICD-10-CM | POA: Diagnosis not present

## 2016-02-12 DIAGNOSIS — G4733 Obstructive sleep apnea (adult) (pediatric): Secondary | ICD-10-CM | POA: Diagnosis not present

## 2016-03-06 ENCOUNTER — Telehealth: Payer: Self-pay | Admitting: Physician Assistant

## 2016-03-06 NOTE — Telephone Encounter (Signed)
I called pt and talk to his sister and she informed me he declines having the flu vaccine this year so please document

## 2016-03-13 DIAGNOSIS — F251 Schizoaffective disorder, depressive type: Secondary | ICD-10-CM | POA: Diagnosis not present

## 2016-03-14 DIAGNOSIS — G4733 Obstructive sleep apnea (adult) (pediatric): Secondary | ICD-10-CM | POA: Diagnosis not present

## 2016-03-27 ENCOUNTER — Ambulatory Visit (INDEPENDENT_AMBULATORY_CARE_PROVIDER_SITE_OTHER): Payer: PPO

## 2016-03-27 ENCOUNTER — Ambulatory Visit (INDEPENDENT_AMBULATORY_CARE_PROVIDER_SITE_OTHER): Payer: PPO | Admitting: Physician Assistant

## 2016-03-27 ENCOUNTER — Encounter: Payer: Self-pay | Admitting: Physician Assistant

## 2016-03-27 VITALS — BP 115/74 | HR 79 | Temp 98.3°F | Ht 71.0 in | Wt 309.0 lb

## 2016-03-27 DIAGNOSIS — R05 Cough: Secondary | ICD-10-CM

## 2016-03-27 DIAGNOSIS — J4551 Severe persistent asthma with (acute) exacerbation: Secondary | ICD-10-CM | POA: Diagnosis not present

## 2016-03-27 DIAGNOSIS — N4883 Acquired buried penis: Secondary | ICD-10-CM | POA: Diagnosis not present

## 2016-03-27 DIAGNOSIS — R0602 Shortness of breath: Secondary | ICD-10-CM

## 2016-03-27 DIAGNOSIS — R0902 Hypoxemia: Secondary | ICD-10-CM | POA: Diagnosis not present

## 2016-03-27 DIAGNOSIS — J45909 Unspecified asthma, uncomplicated: Secondary | ICD-10-CM | POA: Insufficient documentation

## 2016-03-27 DIAGNOSIS — R918 Other nonspecific abnormal finding of lung field: Secondary | ICD-10-CM | POA: Insufficient documentation

## 2016-03-27 DIAGNOSIS — R059 Cough, unspecified: Secondary | ICD-10-CM | POA: Insufficient documentation

## 2016-03-27 MED ORDER — METHYLPREDNISOLONE SODIUM SUCC 125 MG IJ SOLR
125.0000 mg | Freq: Once | INTRAMUSCULAR | Status: AC
  Administered 2016-03-27: 125 mg via INTRAMUSCULAR

## 2016-03-27 MED ORDER — PREDNISONE 20 MG PO TABS: ORAL_TABLET | ORAL | 0 refills | Status: DC

## 2016-03-27 MED ORDER — AMBULATORY NON FORMULARY MEDICATION: 0 refills | Status: DC

## 2016-03-27 MED ORDER — IPRATROPIUM-ALBUTEROL 0.5-2.5 (3) MG/3ML IN SOLN
3.0000 mL | Freq: Once | RESPIRATORY_TRACT | Status: AC
  Administered 2016-03-27: 3 mL via RESPIRATORY_TRACT

## 2016-03-27 NOTE — Progress Notes (Signed)
Call pt: bronchitic changes no pneumonia.  Appears to be some pulmonary nodules. Would like to get CT scan for further evaluation. Are you ok with this?

## 2016-03-27 NOTE — Progress Notes (Addendum)
Subjective:     Patient ID: Ryan Berg, male   DOB: [birth year], 71 y.o.   MRN: [id number]  HPI  This is a 71 year old male with a history of  Chronic persistent asthma who has had productive cough, shortness of breath, and nasal congestion for approximately 1 week. States his congestion is worst in the morning. Uses Breo daily and albuterol 2-3 times a day. Reports both of these inhalers temporarily help however he begins wheezing again shortly after. State no OTC medications have helped. Prescribed flonase however has not been using it. No leg swelling, chest pain, palpitations. No history of recent travel, surgery, trauma, or blood clots.   History of severe OSA however patient has been unable to use a CPAP due to extreme discomfort.  Also relays concerns over genitalia. States his penis has become retracted and he has difficulty using the bathroom without urinating on himself.   Review of Systems See HPI.     Objective:   Physical Exam  Constitutional: He is oriented to person, place, and time. He appears well-developed and well-nourished. No distress.  HENT:  Head: Normocephalic and atraumatic.  Mouth/Throat: Oropharynx is clear and moist. No oropharyngeal exudate.  Eyes: Conjunctivae are normal. Right eye exhibits no discharge. Left eye exhibits no discharge.  Neck: Normal range of motion. Neck supple. No JVD present. No thyromegaly present.  Cardiovascular: Normal rate and regular rhythm.  Exam reveals no gallop and no friction rub.   No murmur heard. Pulmonary/Chest:  Breathing somewhat labored. Expiratory wheezes heard on exam. No crackles, rales, rhonchi.   Musculoskeletal: Normal range of motion. He exhibits no edema or tenderness.  Lymphadenopathy:    He has no cervical adenopathy.  Neurological: He is alert and oriented to person, place, and time.  Skin: Skin is warm and dry.  No bilateral leg changes/tenderness/redness.   Psychiatric: He has a normal mood and affect. His  behavior is normal.   Blood pressure 115/74, pulse 79, temperature 98.3 F (36.8 C), height  (1.803 m), weight (!) 309 lb (140.2 kg), SpO2 85 %.    Assessment/Plan:  Ryan Berg was seen today for cough.  Diagnoses and all orders for this visit:  Acquired buried penis       - Reassured patient that this is benign process related to weight gain and swelling.             - Discussed referral to urology if he is concerned or quality of life is greatly impacted.   Severe persistent chronic asthma with acute exacerbation -     DG Chest 2 View -     predniSONE (DELTASONE) 20 MG tablet; Take 3 tablets for 3 days, take 2 tablets for 3 days, take 1 tablet for 3 days, take 1/2 tablet for 4 days. -     AMBULATORY NON FORMULARY MEDICATION; Due to chronic asthma and hypoxemia pulse ox at rest on room air was 83 percent and needs 2L continuous O2 and portable.  Hypoxemia - DG Chest 2 View -     predniSONE (DELTASONE) 20 MG tablet; Take 3 tablets for 3 days, take 2 tablets for 3 days, take 1 tablet for 3 days, take 1/2 tablet for 4 days. -     AMBULATORY NON FORMULARY MEDICATION; Due to chronic asthma and hypoxemia pulse ox at rest on room air was 83 percent and needs 2L continuous O2 and portable.  Cough -     DG Chest 2 View -  predniSONE (DELTASONE) 20 MG tablet; Take 3 tablets for 3 days, take 2 tablets for 3 days, take 1 tablet for 3 days, take 1/2 tablet for 4 days. -     AMBULATORY NON FORMULARY MEDICATION; Due to chronic asthma and hypoxemia pulse ox at rest on room air was 83 percent and needs 2L continuous O2 and portable. Risk for DVT/PE low due to HPI and PE.   Nebulized combivent given in office today with some relief of wheezes however patient's oxygen saturation remained low.  Due to chronic persisent asthma and hypoxemia room air at rest pulse ox was 83 percent. When put on 2 L O2 through nasal canula patient's O2 saturation improved to 94%. Discussed hospitalization for O2  saturations less than 90% however patient declined going at this moment but agrees to go if his O2 remains persistently below 90% at home.   No crackles heard on auscultation and patient is afebrile, however will rule out infectious process with chest x ray today. However this does sound like asthma exacerbation.   IM solumedrol given in office today for SOB.

## 2016-03-28 ENCOUNTER — Telehealth: Payer: Self-pay

## 2016-03-28 DIAGNOSIS — M25552 Pain in left hip: Secondary | ICD-10-CM | POA: Diagnosis not present

## 2016-03-28 DIAGNOSIS — Z6841 Body Mass Index (BMI) 40.0 and over, adult: Secondary | ICD-10-CM | POA: Diagnosis not present

## 2016-03-28 DIAGNOSIS — T380X5A Adverse effect of glucocorticoids and synthetic analogues, initial encounter: Secondary | ICD-10-CM | POA: Diagnosis not present

## 2016-03-28 DIAGNOSIS — Z79899 Other long term (current) drug therapy: Secondary | ICD-10-CM | POA: Diagnosis not present

## 2016-03-28 DIAGNOSIS — R739 Hyperglycemia, unspecified: Secondary | ICD-10-CM | POA: Diagnosis not present

## 2016-03-28 DIAGNOSIS — E785 Hyperlipidemia, unspecified: Secondary | ICD-10-CM | POA: Diagnosis not present

## 2016-03-28 DIAGNOSIS — J45909 Unspecified asthma, uncomplicated: Secondary | ICD-10-CM | POA: Diagnosis not present

## 2016-03-28 DIAGNOSIS — Z7902 Long term (current) use of antithrombotics/antiplatelets: Secondary | ICD-10-CM | POA: Diagnosis not present

## 2016-03-28 DIAGNOSIS — I10 Essential (primary) hypertension: Secondary | ICD-10-CM | POA: Diagnosis not present

## 2016-03-28 DIAGNOSIS — Z23 Encounter for immunization: Secondary | ICD-10-CM | POA: Diagnosis not present

## 2016-03-28 DIAGNOSIS — I700 Atherosclerosis of aorta: Secondary | ICD-10-CM | POA: Diagnosis not present

## 2016-03-28 DIAGNOSIS — J441 Chronic obstructive pulmonary disease with (acute) exacerbation: Secondary | ICD-10-CM | POA: Diagnosis not present

## 2016-03-28 DIAGNOSIS — G4733 Obstructive sleep apnea (adult) (pediatric): Secondary | ICD-10-CM | POA: Diagnosis not present

## 2016-03-28 DIAGNOSIS — M25551 Pain in right hip: Secondary | ICD-10-CM | POA: Diagnosis not present

## 2016-03-28 DIAGNOSIS — F209 Schizophrenia, unspecified: Secondary | ICD-10-CM | POA: Diagnosis not present

## 2016-03-28 DIAGNOSIS — I2510 Atherosclerotic heart disease of native coronary artery without angina pectoris: Secondary | ICD-10-CM | POA: Diagnosis not present

## 2016-03-28 DIAGNOSIS — E872 Acidosis: Secondary | ICD-10-CM | POA: Diagnosis not present

## 2016-03-28 DIAGNOSIS — K760 Fatty (change of) liver, not elsewhere classified: Secondary | ICD-10-CM | POA: Diagnosis not present

## 2016-03-28 DIAGNOSIS — R0602 Shortness of breath: Secondary | ICD-10-CM | POA: Diagnosis not present

## 2016-03-28 DIAGNOSIS — Z87891 Personal history of nicotine dependence: Secondary | ICD-10-CM | POA: Diagnosis not present

## 2016-03-28 NOTE — Telephone Encounter (Signed)
Pt's care taker called and stated that his oxygen levels were between 89-93 on home oxygen. His oxygen level was at 91% with oxygen when I was on the phone.  Pt's care taker was advised to go to the Emergency Department. Pt's care taker understood and was agreeable.

## 2016-03-29 ENCOUNTER — Other Ambulatory Visit: Payer: Self-pay | Admitting: *Deleted

## 2016-03-29 DIAGNOSIS — J441 Chronic obstructive pulmonary disease with (acute) exacerbation: Secondary | ICD-10-CM | POA: Diagnosis not present

## 2016-03-29 MED ORDER — METOPROLOL TARTRATE 25 MG PO TABS: 25.0000 mg | ORAL_TABLET | Freq: Two times a day (BID) | ORAL | 3 refills | Status: DC

## 2016-03-30 DIAGNOSIS — J441 Chronic obstructive pulmonary disease with (acute) exacerbation: Secondary | ICD-10-CM | POA: Diagnosis not present

## 2016-03-31 DIAGNOSIS — R0602 Shortness of breath: Secondary | ICD-10-CM | POA: Diagnosis not present

## 2016-03-31 DIAGNOSIS — J441 Chronic obstructive pulmonary disease with (acute) exacerbation: Secondary | ICD-10-CM | POA: Diagnosis not present

## 2016-03-31 DIAGNOSIS — I313 Pericardial effusion (noninflammatory): Secondary | ICD-10-CM | POA: Diagnosis not present

## 2016-04-01 DIAGNOSIS — J441 Chronic obstructive pulmonary disease with (acute) exacerbation: Secondary | ICD-10-CM | POA: Diagnosis not present

## 2016-04-02 DIAGNOSIS — J441 Chronic obstructive pulmonary disease with (acute) exacerbation: Secondary | ICD-10-CM | POA: Diagnosis not present

## 2016-04-03 DIAGNOSIS — J441 Chronic obstructive pulmonary disease with (acute) exacerbation: Secondary | ICD-10-CM | POA: Diagnosis not present

## 2016-04-05 DIAGNOSIS — G4733 Obstructive sleep apnea (adult) (pediatric): Secondary | ICD-10-CM | POA: Diagnosis not present

## 2016-04-10 DIAGNOSIS — F251 Schizoaffective disorder, depressive type: Secondary | ICD-10-CM | POA: Diagnosis not present

## 2016-04-11 ENCOUNTER — Ambulatory Visit (INDEPENDENT_AMBULATORY_CARE_PROVIDER_SITE_OTHER): Payer: PPO | Admitting: Physician Assistant

## 2016-04-11 ENCOUNTER — Encounter: Payer: Self-pay | Admitting: Physician Assistant

## 2016-04-11 VITALS — BP 127/70 | HR 63

## 2016-04-11 DIAGNOSIS — G4733 Obstructive sleep apnea (adult) (pediatric): Secondary | ICD-10-CM | POA: Diagnosis not present

## 2016-04-11 DIAGNOSIS — J441 Chronic obstructive pulmonary disease with (acute) exacerbation: Secondary | ICD-10-CM | POA: Diagnosis not present

## 2016-04-11 DIAGNOSIS — F251 Schizoaffective disorder, depressive type: Secondary | ICD-10-CM

## 2016-04-11 MED ORDER — FLUTICASONE-UMECLIDIN-VILANT 100-62.5-25 MCG/INH IN AEPB: 1.0000 | INHALATION_SPRAY | Freq: Every day | RESPIRATORY_TRACT | 3 refills | Status: DC

## 2016-04-11 NOTE — Progress Notes (Signed)
   Subjective:    Patient ID: Ryan Berg, male    DOB: 01/13/1946, [age] y.o.   MRN: [id number]  HPI  Pt is a 71 yo male with COPD who presents to the clinic for hospital follow up.  He was seen for COPD exacerbation with chronic hypoxia and respiratory failure. He was given solumedrol IV, bronchodialators and Bipap. He was sen home on O2 at 2L. He is still on predisone and doxycycline. He continues BREO daily. He is using sleep apnea machine. He feels like he is loosing air. He keeps in on all night. He does feel a little more rested in am. It was determined that recent use of inhaled marijuana was worsening breathing and sOB.   His mood is really down. He feels like he has nothing to live for and his life is meaningless. He already sees psych. I would like to get him in with counseling.     Review of Systems  All other systems reviewed and are negative.      Objective:   Physical Exam  Constitutional: He is oriented to person, place, and time. He appears well-developed and well-nourished.  Morbid obesity.   HENT:  Head: Normocephalic and atraumatic.  Cardiovascular: Normal rate, regular rhythm and normal heart sounds.   Pulmonary/Chest: Effort normal and breath sounds normal. No respiratory distress. He has no wheezes.  Neurological: He is alert and oriented to person, place, and time.  Psychiatric: He has a normal mood and affect. His behavior is normal.          Assessment & Plan:  Marland Kitchen.Marland Kitchen.Diagnoses and all orders for this visit:  COPD with acute exacerbation (HCC) -     Fluticasone-Umeclidin-Vilant (TRELEGY ELLIPTA) 100-62.5-25 MCG/INH AEPB; Inhale 1 puff into the lungs daily.  Severe obstructive sleep apnea  Schizoaffective disorder, depressive type [facility](HCC) -     Ambulatory referral to Psychology   [facility]RE[facility]y I would like to see if can get approved. I think it will increase lung function. Goal to start tapering down on O2 during the day to 1L and then as  needed. Encouraged NOT to inhale marijuana it is hurting his lungs. He agrees to stop.   Continue to use CPAP machine if having any concerns or problems reach out to company I do think this will help energy/depression/COPD.   Will get in with counseling. Encouraged to follow up with psych to see if any medications need to be adjusted.   Follow up in 3 months.

## 2016-04-13 ENCOUNTER — Telehealth: Payer: Self-pay | Admitting: Physician Assistant

## 2016-04-13 ENCOUNTER — Encounter: Payer: Self-pay | Admitting: Physician Assistant

## 2016-04-13 NOTE — Telephone Encounter (Signed)
Received PA on Trelegy Ellipta sent through cover my meds waiting on authorization. - CF

## 2016-04-18 DIAGNOSIS — I10 Essential (primary) hypertension: Secondary | ICD-10-CM | POA: Diagnosis not present

## 2016-04-18 DIAGNOSIS — J449 Chronic obstructive pulmonary disease, unspecified: Secondary | ICD-10-CM | POA: Diagnosis not present

## 2016-04-18 DIAGNOSIS — G4733 Obstructive sleep apnea (adult) (pediatric): Secondary | ICD-10-CM | POA: Diagnosis not present

## 2016-04-18 DIAGNOSIS — I2510 Atherosclerotic heart disease of native coronary artery without angina pectoris: Secondary | ICD-10-CM | POA: Diagnosis not present

## 2016-04-19 ENCOUNTER — Other Ambulatory Visit: Payer: Self-pay | Admitting: *Deleted

## 2016-04-19 MED ORDER — MONTELUKAST SODIUM 10 MG PO TABS: 10.0000 mg | ORAL_TABLET | Freq: Every day | ORAL | 11 refills | Status: DC

## 2016-04-23 NOTE — Telephone Encounter (Signed)
Appeal sent for denied Trelegy ellipta. Dx code of COPD was used instead. Appeal approved. Patient's sister and pharmacy has been notified

## 2016-05-01 DIAGNOSIS — G4733 Obstructive sleep apnea (adult) (pediatric): Secondary | ICD-10-CM | POA: Diagnosis not present

## 2016-05-06 DIAGNOSIS — G4733 Obstructive sleep apnea (adult) (pediatric): Secondary | ICD-10-CM | POA: Diagnosis not present

## 2016-05-08 DIAGNOSIS — F251 Schizoaffective disorder, depressive type: Secondary | ICD-10-CM | POA: Diagnosis not present

## 2016-05-09 DIAGNOSIS — J449 Chronic obstructive pulmonary disease, unspecified: Secondary | ICD-10-CM | POA: Diagnosis not present

## 2016-05-12 DIAGNOSIS — G4733 Obstructive sleep apnea (adult) (pediatric): Secondary | ICD-10-CM | POA: Diagnosis not present

## 2016-05-17 DIAGNOSIS — Z6841 Body Mass Index (BMI) 40.0 and over, adult: Secondary | ICD-10-CM | POA: Diagnosis not present

## 2016-05-17 DIAGNOSIS — Z87891 Personal history of nicotine dependence: Secondary | ICD-10-CM | POA: Diagnosis not present

## 2016-05-17 DIAGNOSIS — G4733 Obstructive sleep apnea (adult) (pediatric): Secondary | ICD-10-CM | POA: Diagnosis not present

## 2016-05-17 DIAGNOSIS — J4530 Mild persistent asthma, uncomplicated: Secondary | ICD-10-CM | POA: Diagnosis not present

## 2016-05-24 ENCOUNTER — Telehealth: Payer: Self-pay | Admitting: *Deleted

## 2016-05-24 DIAGNOSIS — I2510 Atherosclerotic heart disease of native coronary artery without angina pectoris: Secondary | ICD-10-CM | POA: Diagnosis not present

## 2016-05-24 DIAGNOSIS — E785 Hyperlipidemia, unspecified: Secondary | ICD-10-CM | POA: Diagnosis not present

## 2016-05-24 DIAGNOSIS — R339 Retention of urine, unspecified: Secondary | ICD-10-CM | POA: Diagnosis not present

## 2016-05-24 DIAGNOSIS — Z992 Dependence on renal dialysis: Secondary | ICD-10-CM | POA: Diagnosis not present

## 2016-05-24 DIAGNOSIS — F209 Schizophrenia, unspecified: Secondary | ICD-10-CM | POA: Diagnosis not present

## 2016-05-24 DIAGNOSIS — J45909 Unspecified asthma, uncomplicated: Secondary | ICD-10-CM | POA: Diagnosis not present

## 2016-05-24 DIAGNOSIS — R338 Other retention of urine: Secondary | ICD-10-CM | POA: Diagnosis not present

## 2016-05-24 NOTE — Telephone Encounter (Signed)
Patient's sister called and states patient has not been able to urinate in a day and she says he is swollen in his private area. I did advise his sister that the patient does need to go to the ER immediately. She did agree and she will advise the patient accordingly.

## 2016-05-25 DIAGNOSIS — R310 Gross hematuria: Secondary | ICD-10-CM | POA: Diagnosis not present

## 2016-05-25 DIAGNOSIS — N401 Enlarged prostate with lower urinary tract symptoms: Secondary | ICD-10-CM | POA: Diagnosis not present

## 2016-05-25 DIAGNOSIS — N138 Other obstructive and reflux uropathy: Secondary | ICD-10-CM | POA: Diagnosis not present

## 2016-05-25 DIAGNOSIS — R339 Retention of urine, unspecified: Secondary | ICD-10-CM | POA: Diagnosis not present

## 2016-05-28 DIAGNOSIS — N138 Other obstructive and reflux uropathy: Secondary | ICD-10-CM | POA: Diagnosis not present

## 2016-05-28 DIAGNOSIS — N401 Enlarged prostate with lower urinary tract symptoms: Secondary | ICD-10-CM | POA: Diagnosis not present

## 2016-05-31 DIAGNOSIS — R339 Retention of urine, unspecified: Secondary | ICD-10-CM | POA: Diagnosis not present

## 2016-05-31 DIAGNOSIS — N138 Other obstructive and reflux uropathy: Secondary | ICD-10-CM | POA: Diagnosis not present

## 2016-05-31 DIAGNOSIS — N401 Enlarged prostate with lower urinary tract symptoms: Secondary | ICD-10-CM | POA: Diagnosis not present

## 2016-06-03 ENCOUNTER — Encounter: Payer: Self-pay | Admitting: Emergency Medicine

## 2016-06-03 ENCOUNTER — Emergency Department (INDEPENDENT_AMBULATORY_CARE_PROVIDER_SITE_OTHER): Payer: PPO

## 2016-06-03 ENCOUNTER — Emergency Department (INDEPENDENT_AMBULATORY_CARE_PROVIDER_SITE_OTHER)
Admission: EM | Admit: 2016-06-03 | Discharge: 2016-06-03 | Disposition: A | Discharge status: Home / Self Care | Payer: PPO | Source: Home / Self Care | Attending: Family Medicine | Admitting: Family Medicine

## 2016-06-03 DIAGNOSIS — I517 Cardiomegaly: Secondary | ICD-10-CM

## 2016-06-03 DIAGNOSIS — R0602 Shortness of breath: Secondary | ICD-10-CM | POA: Diagnosis not present

## 2016-06-03 DIAGNOSIS — J069 Acute upper respiratory infection, unspecified: Secondary | ICD-10-CM

## 2016-06-03 DIAGNOSIS — J441 Chronic obstructive pulmonary disease with (acute) exacerbation: Secondary | ICD-10-CM

## 2016-06-03 DIAGNOSIS — B9789 Other viral agents as the cause of diseases classified elsewhere: Secondary | ICD-10-CM | POA: Diagnosis not present

## 2016-06-03 LAB — POCT CBC W AUTO DIFF (K'VILLE URGENT CARE)

## 2016-06-03 MED ORDER — BENZONATATE 200 MG PO CAPS: ORAL_CAPSULE | ORAL | 0 refills | Status: DC

## 2016-06-03 MED ORDER — FUROSEMIDE 20 MG PO TABS: ORAL_TABLET | ORAL | 0 refills | Status: DC

## 2016-06-03 MED ORDER — IPRATROPIUM-ALBUTEROL 0.5-2.5 (3) MG/3ML IN SOLN
3.0000 mL | Freq: Four times a day (QID) | RESPIRATORY_TRACT | Status: DC
  Administered 2016-06-03 (×2): 3 mL via RESPIRATORY_TRACT

## 2016-06-03 MED ORDER — DOXYCYCLINE HYCLATE 100 MG PO CAPS: 100.0000 mg | ORAL_CAPSULE | Freq: Two times a day (BID) | ORAL | 0 refills | Status: DC

## 2016-06-03 MED ORDER — METHYLPREDNISOLONE SODIUM SUCC 125 MG IJ SOLR
80.0000 mg | Freq: Once | INTRAMUSCULAR | Status: AC
  Administered 2016-06-03: 80 mg via INTRAMUSCULAR

## 2016-06-03 NOTE — Discharge Instructions (Signed)
Stop Hydrochlorothiazide each morning for now, and substitute Lasix. Resume Hydrochlorothiazide after finishing Lasix. Continue all inhalers as prescribed.  Continue oxygen as prescribed. Resume taking tapering course of prednisone tomorrow. Take plain guaifenesin (1200mg  extended release tabs such as Mucinex) twice daily, with plenty of water, for cough and congestion.  Get adequate rest.   May use Afrin nasal spray (or generic oxymetazoline) each morning for about 5 days and then discontinue.  Also recommend using saline nasal spray several times daily and saline nasal irrigation (AYR is a common brand).  Try warm salt water gargles for sore throat.  Stop all antihistamines for now, and other non-prescription cough/cold preparations.   If symptoms become significantly worse during the night or over the weekend, proceed to the local emergency room.

## 2016-06-03 NOTE — ED Provider Notes (Signed)
[facility]    CSN: [id number] Arrival date & time: 06/03/16  1106     History   Chief Complaint Chief Complaint  Patient presents with  . Shortness of Breath    HPI Cleotilde Neererry Sharpless is a 71 y.o. male.   Patient complains of one week history of increased shortness of breath with activity and occasional wheezing.  He has had a mild sore throat and developed increased cough worse at night.  He uses O2 at home at 2L/min.  He has been using his albuterol inhaler more frequently.  He denies pleuritic pain.  No fevers, chills, and sweats.  He notes that he has developed swelling in his hands/feet during the past week.   The history is provided by the patient.    Past Medical History:  Diagnosis Date  . Bipolar affective [facility](HCC)     Patient Active Problem List   Diagnosis Date Noted  . Severe persistent asthma with acute exacerbation 06/12/2016  . Acquired buried penis 03/27/2016  . Chronic asthma 03/27/2016  . Hypoxemia 03/27/2016  . Cough 03/27/2016  . Pulmonary nodules/lesions, multiple 03/27/2016  . Severe obstructive sleep apnea 11/14/2015  . Dyspnea 10/17/2015  . Swelling of limb 10/14/2015  . Asthma, mild persistent 10/14/2015  . Snoring 10/14/2015  . Obesity 10/14/2015  . Sleeping difficulties 10/14/2015  . Coronary artery disease involving native coronary artery of native heart without angina pectoris 10/14/2015  . Schizoaffective disorder, depressive type (HCC) 10/12/2015    Past Surgical History:  Procedure Laterality Date  . APPENDECTOMY    . HERNIA REPAIR         Home Medications    Prior to Admission medications   Medication Sig Start Date End Date Taking? Authorizing Provider  albuterol (PROVENTIL HFA;VENTOLIN HFA) 108 (90 Base) MCG/ACT inhaler Inhale 2 puffs into the lungs every 6 (six) hours as needed for wheezing or shortness of breath. 10/12/15   Breeback, Jade L, PA-C  AMBULATORY NON FORMULARY MEDICATION CPAP and supplies set on auto  range. 01/02/16   Breeback, Lonna CobbJade L, PA-C  AMBULATORY NON FORMULARY MEDICATION Due to chronic asthma and hypoxemia pulse ox at rest on room air was 83 percent and needs 2L continuous O2 and portable. 03/27/16   Breeback, Lonna CobbJade L, PA-C  AMBULATORY NON FORMULARY MEDICATION Medication Name: Nebulizer with tubing and supplies for Asthma.  I belive he uses [employer] for his oxygen.  Albuterol 2.5mg /483ml neb solution.  Given Q 6 hours PRN SOB or wheezing.  #100 with 3 RF. 06/11/16   Breeback, Jade L, PA-C  atorvastatin (LIPITOR) 10 MG tablet Take 1 tablet (10 mg total) by mouth daily at 6 PM. 11/07/15   Breeback, Jade L, PA-C  clopidogrel (PLAVIX) 75 MG tablet TAKE 1 TABLET BY MOUTH EVERY DAY 12/09/15   Breeback, Jade L, PA-C  divalproex (DEPAKOTE) 500 MG DR tablet Take by mouth. 09/19/15   [provider]  DOK 100 MG capsule TAKE 1 CAPSULE BY MOUTH 2 TIMES DAILY 02/10/16   Breeback, Jade L, PA-C  fluticasone (FLONASE) 50 MCG/ACT nasal spray Place into the nose.    [provider]  Fluticasone-Umeclidin-Vilant (TRELEGY ELLIPTA) 100-62.5-25 MCG/INH AEPB Inhale 1 puff into the lungs daily. 04/11/16   Jomarie LongsBreeback, Jade L, PA-C  gabapentin (NEURONTIN) 100 MG capsule Take by mouth. 09/19/15 09/18/16  [provider]  haloperidol (HALDOL) 10 MG tablet Take by mouth. 09/19/15 10/19/15  [provider]  haloperidol decanoate (HALDOL DECANOATE) 50 MG/ML injection Inject into the muscle.  09/19/15   [provider]  hydrochlorothiazide (MICROZIDE) 12.5 MG capsule TAKE 1 CAPSULE BY MOUTH DAILY AS NEEDED FOR SWELLING 06/07/16   Breeback, Jade L, PA-C  metoprolol tartrate (LOPRESSOR) 25 MG tablet Take 1 tablet (25 mg total) by mouth 2 (two) times daily. 03/29/16 03/29/17  Jomarie Longs, PA-C  mirtazapine (REMERON) 30 MG tablet Take by mouth. 09/19/15 10/19/15  [provider]  montelukast (SINGULAIR) 10 MG tablet Take 1 tablet (10 mg total) by mouth at bedtime. 04/19/16   Breeback, Jade L,  PA-C  polyethylene glycol (MIRALAX / GLYCOLAX) packet Take 17 g by mouth 2 (two) times daily. Until stooling regularly 11/22/15   Monica Becton, MD  predniSONE (DELTASONE) 20 MG tablet Take 3 tablets for 3 days, take 2 tablets for 3 days, take 1 tablet for 3 days, take 1/2 tablet for 4 days. 06/11/16   Breeback, Jade L, PA-C  senna-docusate (SENOKOT-S) 8.6-50 MG tablet Take 2 tablets by mouth 2 (two) times daily. Until stooling regularly 11/22/15   Monica Becton, MD    Family History No family history on file.  Social History Social History  Substance Use Topics  . Smoking status: Never Smoker  . Smokeless tobacco: Never Used  . Alcohol use No     Allergies   Patient has no known allergies.   Review of Systems Review of Systems + sore throat + cough No pleuritic pain ? wheezing + nasal congestion + post-nasal drainage No sinus pain/pressure No itchy/red eyes No earache No hemoptysis + SOB No fever/chills No nausea No vomiting No abdominal pain No diarrhea No urinary symptoms No skin rash + fatigue No myalgias No headache Used OTC meds without relief   Physical Exam Triage Vital Signs ED Triage Vitals  Enc Vitals Group     BP 06/03/16 1134 128/79     Pulse Rate 06/03/16 1134 82     Resp --      Temp 06/03/16 1134 98.9 F (37.2 C)     Temp Source 06/03/16 1134 Oral     SpO2 06/03/16 1134 (!) 87 %     Weight 06/03/16 1137 (!) 309 lb (140.2 kg)     Height 06/03/16 1137 5\' 11"  (1.803 m)     Head Circumference --      Peak Flow --      Pain Score 06/03/16 1138 0     Pain Loc --      Pain Edu? --      Excl. in GC? --    No data found.   Updated Vital Signs BP 128/79 (BP Location: Left Arm)   Pulse 82   Temp 98.9 F (37.2 C) (Oral)   Ht 5\' 11"  (1.803 m)   Wt (!) 309 lb (140.2 kg)   SpO2 (!) 87%   BMI 43.10 kg/m   Visual Acuity Right Eye Distance:   Left Eye Distance:   Bilateral Distance:    Right Eye Near:   Left Eye  Near:    Bilateral Near:     Physical Exam Nursing notes and Vital Signs reviewed. Appearance:  Patient appears stated age, and in no acute distress Eyes:  Pupils are equal, round, and reactive to light and accomodation.  Extraocular movement is intact.  Conjunctivae are not inflamed  Ears:  Canals normal.  Tympanic membranes normal.  Nose:  Mildly congested turbinates.  No sinus tenderness.    Pharynx:  Normal Neck:  Supple.  Tender enlarged posterior/lateral nodes are  palpated bilaterally  Lungs:  Course breath sounds and scattered rhonchi.  Breath sounds are equal.  Moving air well. Heart:  Regular rate and rhythm without murmurs, rubs, or gallops.  Abdomen:  Nontender without masses or hepatosplenomegaly.  Bowel sounds are present.  No CVA or flank tenderness.  Extremities:  Trace lower leg edema.  Skin:  No rash present.    UC Treatments / Results  Labs (all labs ordered are listed, but only abnormal results are displayed) Labs Reviewed  BRAIN NATRIURETIC PEPTIDE   Narrative:    Performed at:  [facility]                [address], [address]                [place], [place] [address]  POCT CBC W AUTO DIFF ([facility]):  WBC 8.0; LY 18.8; MO 9.6; GR 71.6; Hgb 14.5; Platelets 178     EKG  EKG Interpretation None       Radiology No results found.  Procedures Procedures (including critical care time)  Medications Ordered in UC Medications  methylPREDNISolone sodium succinate (SOLU-MEDROL) 125 mg/2 mL injection 80 mg (80 mg Intramuscular Given 06/03/16 1320)     Initial Impression / Assessment and Plan / UC Course  I have reviewed the triage vital signs and the nursing notes.  Pertinent labs & imaging results that were available during my care of the patient were reviewed by me and considered in my medical decision making (see chart for details).    Normal WBC reassuring.  Suspect mild CHF. BNP and CMP pending. Rx for Lasix 20mg  QAM. Begin  empiric doxycycline 100mg  BID. Administered DuoNeb by hand held nebulizer  Administered Solumedrol 80mg . Prescription written for Benzonatate (Tessalon) to take at bedtime for night-time cough.  Stop Hydrochlorothiazide each morning for now, and substitute Lasix. Resume Hydrochlorothiazide after finishing Lasix. Continue all inhalers as prescribed.  Continue oxygen as prescribed. Resume taking tapering course of prednisone tomorrow. Take plain guaifenesin (1200mg  extended release tabs such as Mucinex) twice daily, with plenty of water, for cough and congestion.  Get adequate rest.   May use Afrin nasal spray (or generic oxymetazoline) each morning for about 5 days and then discontinue.  Also recommend using saline nasal spray several times daily and saline nasal irrigation (AYR is a common brand).  Try warm salt water gargles for sore throat.  Stop all antihistamines for now, and other non-prescription cough/cold preparations.   If symptoms become significantly worse during the night or over the weekend, proceed to the local emergency room.   Followup with PCP in 8 days.    Final Clinical Impressions(s) / UC Diagnoses   Final diagnoses:  Viral URI with cough  COPD exacerbation (HCC)    New Prescriptions Discharge Medication List as of 06/03/2016  1:26 PM    START taking these medications   Details  benzonatate (TESSALON) 200 MG capsule Take one cap by mouth at bedtime as needed for cough.  May repeat in 4 to 6 hours, Print    doxycycline (VIBRAMYCIN) 100 MG capsule Take 1 capsule (100 mg total) by mouth 2 (two) times daily. Take with food., Starting Sun 06/03/2016, Print    furosemide (LASIX) 20 MG tablet Take one tab PO QAM, Print         Lattie Haw, MD 06/13/16 1212

## 2016-06-03 NOTE — ED Triage Notes (Signed)
[facility] a [facility] x 1 week, he states his symptoms have gotten worse over the past day.

## 2016-06-05 ENCOUNTER — Other Ambulatory Visit: Payer: Self-pay | Admitting: Physician Assistant

## 2016-06-05 ENCOUNTER — Telehealth: Payer: Self-pay | Admitting: *Deleted

## 2016-06-05 DIAGNOSIS — G4733 Obstructive sleep apnea (adult) (pediatric): Secondary | ICD-10-CM | POA: Diagnosis not present

## 2016-06-05 LAB — BRAIN NATRIURETIC PEPTIDE

## 2016-06-05 NOTE — Telephone Encounter (Signed)
LM with lab results and to call back if he has any questions or concerns.  

## 2016-06-08 ENCOUNTER — Ambulatory Visit (INDEPENDENT_AMBULATORY_CARE_PROVIDER_SITE_OTHER): Payer: PPO | Admitting: Family Medicine

## 2016-06-08 ENCOUNTER — Encounter: Payer: Self-pay | Admitting: Family Medicine

## 2016-06-08 ENCOUNTER — Ambulatory Visit (INDEPENDENT_AMBULATORY_CARE_PROVIDER_SITE_OTHER): Payer: PPO

## 2016-06-08 VITALS — BP 120/71 | HR 92 | Temp 98.5°F | Resp 16 | Ht 71.0 in | Wt 306.0 lb

## 2016-06-08 DIAGNOSIS — R05 Cough: Secondary | ICD-10-CM

## 2016-06-08 DIAGNOSIS — J4551 Severe persistent asthma with (acute) exacerbation: Secondary | ICD-10-CM

## 2016-06-08 DIAGNOSIS — I517 Cardiomegaly: Secondary | ICD-10-CM | POA: Diagnosis not present

## 2016-06-08 DIAGNOSIS — R0602 Shortness of breath: Secondary | ICD-10-CM

## 2016-06-08 DIAGNOSIS — R1012 Left upper quadrant pain: Secondary | ICD-10-CM

## 2016-06-08 DIAGNOSIS — R059 Cough, unspecified: Secondary | ICD-10-CM

## 2016-06-08 LAB — CBC WITH DIFFERENTIAL/PLATELET
BASOS PCT: 0 %
Basophils Absolute: 0 cells/uL (ref 0–200)
Eosinophils Absolute: 140 cells/uL (ref 15–500)
Eosinophils Relative: 1 %
HCT: 42.5 % (ref 38.5–50.0)
Hemoglobin: 14.6 g/dL (ref 13.2–17.1)
LYMPHS PCT: 22 %
Lymphs Abs: 3080 cells/uL (ref 850–3900)
MCH: 34.7 pg — ABNORMAL HIGH (ref 27.0–33.0)
MCHC: 34.4 g/dL (ref 32.0–36.0)
MCV: 101 fL — AB (ref 80.0–100.0)
MONOS PCT: 7 %
MPV: 9.2 fL (ref 7.5–12.5)
Monocytes Absolute: 980 cells/uL — ABNORMAL HIGH (ref 200–950)
NEUTROS PCT: 70 %
Neutro Abs: 9800 cells/uL — ABNORMAL HIGH (ref 1500–7800)
PLATELETS: 237 10*3/uL (ref 140–400)
RBC: 4.21 MIL/uL (ref 4.20–5.80)
RDW: 13.6 % (ref 11.0–15.0)
WBC: 14 10*3/uL — AB (ref 3.8–10.8)

## 2016-06-08 MED ORDER — METHYLPREDNISOLONE SODIUM SUCC 125 MG IJ SOLR
125.0000 mg | Freq: Once | INTRAMUSCULAR | Status: AC
  Administered 2016-06-08: 125 mg via INTRAMUSCULAR

## 2016-06-08 MED ORDER — LEVOFLOXACIN 750 MG PO TABS: 750.0000 mg | ORAL_TABLET | Freq: Every day | ORAL | 0 refills | Status: DC

## 2016-06-08 MED ORDER — METHYLPREDNISOLONE ACETATE 80 MG/ML IJ SUSP
80.0000 mg | Freq: Once | INTRAMUSCULAR | Status: AC
  Administered 2016-06-08: 80 mg via INTRAMUSCULAR

## 2016-06-08 MED ORDER — AMBULATORY NON FORMULARY MEDICATION: 0 refills | Status: DC

## 2016-06-08 MED ORDER — ALBUTEROL SULFATE (2.5 MG/3ML) 0.083% IN NEBU: 2.5000 mg | INHALATION_SOLUTION | Freq: Once | RESPIRATORY_TRACT | Status: DC

## 2016-06-08 NOTE — Progress Notes (Signed)
Subjective:    Patient ID: Ryan Berg, male    DOB: 22-Sep-1945, 71 y.o.   MRN: [id number]  HPI  71 year old male with a history of asthma and obstructive sleep apnea was actually seen recently in the at the urgent care here at the med center in [place] on May 13 approximately 5 days ago. He was diagnosed with a viral upper a superinfection with cough. He was given a prescription for doxycycline as well as furosemide and Tessalon Perles. He had a chest x-ray done which showed 2 small nodules in the lateral right lung noted to be calcified granulomas previously seen on CT scan from 03/28/2016 which was during a hospitalization for acute respiratory failure. There was also some cardiomegaly and some possible mild edema. No sign of pneumonia at the time. He was experiencing cough and shortness of breath. BNP was negative. He is here today because he actually feels like he is getting worse.  Mucinex didn't help.  He did complete the antibiotic.  Felt worse starting 3 days ago.  He is on 2 L of oxygen.  Interestingly he has been on oxygen for about the past 3 months. And he has been on 2 L the entire time. That he reports that his oxygen levels have been running a little lower than usual. Albuterol treatment was about 2 hours ago. He denies any fevers chills or sweats. No GI upset or in fact he is actually been more constipated recently. No nasal congestion but he has had a runny nose. Sputum from his cough has been yellow.He has pain in the left upper quadrant area when he takes a deep breath.  Review of Systems  BP 120/71   Pulse 92   Temp 98.5 F (36.9 C)   Ht 5\' 11"  (1.803 m)   Wt (!) 306 lb (138.8 kg)   SpO2 92% Comment: 2L  BMI 42.68 kg/m     No Known Allergies  Past Medical History:  Diagnosis Date  . Bipolar affective [facility])     Past Surgical History:  Procedure Laterality Date  . APPENDECTOMY    . HERNIA REPAIR      Social History   Social History  . Marital status:  Unknown    Spouse name: N/A  . Number of children: N/A  . Years of education: N/A   Occupational History  . Not on file.   Social History Main Topics  . Smoking status: Never Smoker  . Smokeless tobacco: Never Used  . Alcohol use No  . Drug use: Unknown  . Sexual activity: Not on file   Other Topics Concern  . Not on file   Social History Narrative  . No narrative on file    No family history on file.  Outpatient Encounter Prescriptions as of 06/08/2016  Medication Sig  . albuterol (PROVENTIL HFA;VENTOLIN HFA) 108 (90 Base) MCG/ACT inhaler Inhale 2 puffs into the lungs every 6 (six) hours as needed for wheezing or shortness of breath.  . AMBULATORY NON FORMULARY MEDICATION CPAP and supplies set on auto range.  . AMBULATORY NON FORMULARY MEDICATION Due to chronic asthma and hypoxemia pulse ox at rest on room air was 83 percent and needs 2L continuous O2 and portable.  Marland Kitchen atorvastatin (LIPITOR) 10 MG tablet Take 1 tablet (10 mg total) by mouth daily at 6 PM.  . clopidogrel (PLAVIX) 75 MG tablet TAKE 1 TABLET BY MOUTH EVERY DAY  . divalproex (DEPAKOTE) 500 MG DR tablet Take by mouth.  Marland Kitchen  DOK 100 MG capsule TAKE 1 CAPSULE BY MOUTH 2 TIMES DAILY  . fluticasone (FLONASE) 50 MCG/ACT nasal spray Place into the nose.  Marland Kitchen Fluticasone-Umeclidin-Vilant (TRELEGY ELLIPTA) 100-62.5-25 MCG/INH AEPB Inhale 1 puff into the lungs daily.  Marland Kitchen gabapentin (NEURONTIN) 100 MG capsule Take by mouth.  . haloperidol decanoate (HALDOL DECANOATE) 50 MG/ML injection Inject into the muscle.  . hydrochlorothiazide (MICROZIDE) 12.5 MG capsule TAKE 1 CAPSULE BY MOUTH DAILY AS NEEDED FOR SWELLING  . Lorcaserin HCl (BELVIQ) 10 MG TABS Take 1 tablet by mouth 2 (two) times daily.  . metoprolol tartrate (LOPRESSOR) 25 MG tablet Take 1 tablet (25 mg total) by mouth 2 (two) times daily.  . montelukast (SINGULAIR) 10 MG tablet Take 1 tablet (10 mg total) by mouth at bedtime.  . polyethylene glycol (MIRALAX / GLYCOLAX)  packet Take 17 g by mouth 2 (two) times daily. Until stooling regularly  . senna-docusate (SENOKOT-S) 8.6-50 MG tablet Take 2 tablets by mouth 2 (two) times daily. Until stooling regularly  . [DISCONTINUED] fluticasone furoate-vilanterol (BREO ELLIPTA) 100-25 MCG/INH AEPB Inhale 1 puff into the lungs daily.  . haloperidol (HALDOL) 10 MG tablet Take by mouth.  . mirtazapine (REMERON) 30 MG tablet Take by mouth.  . [DISCONTINUED] benzonatate (TESSALON) 200 MG capsule Take one cap by mouth at bedtime as needed for cough.  May repeat in 4 to 6 hours  . [DISCONTINUED] doxycycline (VIBRAMYCIN) 100 MG capsule Take 1 capsule (100 mg total) by mouth 2 (two) times daily. Take with food.  . [DISCONTINUED] furosemide (LASIX) 20 MG tablet Take one tab PO QAM   No facility-administered encounter medications on file as of 06/08/2016.          Objective:   Physical Exam  Constitutional: He is oriented to person, place, and time. He appears well-developed and well-nourished.  HENT:  Head: Normocephalic and atraumatic.  Right Ear: External ear normal.  Left Ear: External ear normal.  Nose: Nose normal.  Mouth/Throat: Oropharynx is clear and moist.  TMs and canals are clear.   Eyes: Conjunctivae and EOM are normal. Pupils are equal, round, and reactive to light.  Neck: Neck supple. No thyromegaly present.  Cardiovascular: Normal rate and normal heart sounds.   Pulmonary/Chest: Effort normal and breath sounds normal.  Lymphadenopathy:    He has no cervical adenopathy.  Neurological: He is alert and oriented to person, place, and time.  Skin: Skin is warm and dry.  Psychiatric: He has a normal mood and affect.       Assessment & Plan:  Asthma exacerbation - I'm hearing diffuse rhonchi particularly at the base of the lungs bilaterally. Will get repeat chest x-ray today. Explained to him and family that I want to make sure that he doesn't now have a pneumonia or increased fluid in his lungs. No lower  extremity edema on exam today. Pulse ox at 92% on 2 L. He just had an albuterol treatment about 2 hours ago. We'll give him Solu-Medrol and Depo-Medrol IM.  Will work on getting him a nebulizer for home. When he uses his albuterol he says he typically only uses 1 puff. Asked him to come back up and do a nebulizer treatment after his chest x-ray today.  CXR is negative for any pneumonia but there are just some increased interstitial markings. Weight is stable.  Will call with labs once available. For now we'll switch him to Levaquin over the weekend. He can follow-up in 3 days with PCP to recheck lungs. Continue  wearing 2 L of oxygen.

## 2016-06-09 LAB — COMPLETE METABOLIC PANEL WITH GFR
ALT: 23 U/L (ref 9–46)
AST: 24 U/L (ref 10–35)
Albumin: 4.2 g/dL (ref 3.6–5.1)
Alkaline Phosphatase: 54 U/L (ref 40–115)
BUN: 19 mg/dL (ref 7–25)
CHLORIDE: 98 mmol/L (ref 98–110)
CO2: 24 mmol/L (ref 20–31)
CREATININE: 0.93 mg/dL (ref 0.70–1.18)
Calcium: 9.4 mg/dL (ref 8.6–10.3)
GFR, Est Non African American: 83 mL/min (ref >=60)
Glucose, Bld: 112 mg/dL — ABNORMAL HIGH (ref 65–99)
POTASSIUM: 4 mmol/L (ref 3.5–5.3)
Sodium: 138 mmol/L (ref 135–146)
Total Bilirubin: 0.7 mg/dL (ref 0.2–1.2)
Total Protein: 6.5 g/dL (ref 6.1–8.1)

## 2016-06-09 LAB — LIPASE: Lipase: 25 U/L (ref 7–60)

## 2016-06-09 LAB — AMYLASE: AMYLASE: 42 U/L (ref 21–101)

## 2016-06-11 ENCOUNTER — Encounter: Payer: Self-pay | Admitting: Physician Assistant

## 2016-06-11 ENCOUNTER — Ambulatory Visit (INDEPENDENT_AMBULATORY_CARE_PROVIDER_SITE_OTHER): Payer: PPO | Admitting: Physician Assistant

## 2016-06-11 VITALS — BP 131/76 | HR 89 | Ht 71.0 in

## 2016-06-11 DIAGNOSIS — R05 Cough: Secondary | ICD-10-CM | POA: Diagnosis not present

## 2016-06-11 DIAGNOSIS — J4551 Severe persistent asthma with (acute) exacerbation: Secondary | ICD-10-CM | POA: Diagnosis not present

## 2016-06-11 DIAGNOSIS — G4733 Obstructive sleep apnea (adult) (pediatric): Secondary | ICD-10-CM | POA: Diagnosis not present

## 2016-06-11 DIAGNOSIS — R059 Cough, unspecified: Secondary | ICD-10-CM

## 2016-06-11 DIAGNOSIS — R0602 Shortness of breath: Secondary | ICD-10-CM | POA: Diagnosis not present

## 2016-06-11 MED ORDER — PREDNISONE 20 MG PO TABS: ORAL_TABLET | ORAL | 0 refills | Status: DC

## 2016-06-11 MED ORDER — AMBULATORY NON FORMULARY MEDICATION: 0 refills | Status: DC

## 2016-06-11 NOTE — Patient Instructions (Signed)
Finish levaquin.  Continue albuterol inhaler or nebulizer.  Continue mucinex twice a day.

## 2016-06-11 NOTE — Progress Notes (Signed)
   Subjective:    Patient ID: Ryan Berg, male    DOB: 08/19/1945, 71 y.o.   MRN: [id number]  HPI  Pt is a 71 yo male who presents to the clinic with sister to follow up on symptoms after 5/18 visit with Dr. Linford ArnoldMetheney due to SOB/persisent asthma. Pt was started on levaquin and encouraged to get nebulizer started at home.he ws not able to get nebulizer because it was sent to [facility] instead of [facility].  Xray confirm no pneumonia. Continues to be on 2L O2. He is feeling better. He was feeling a lot better until he went out into heat.     Review of Systems See HPI.     Objective:   Physical Exam  Constitutional: He is oriented to person, place, and time. He appears well-developed and well-nourished.  Obese.   HENT:  Head: Normocephalic and atraumatic.  Right Ear: External ear normal.  Left Ear: External ear normal.  Nose: Nose normal.  Mouth/Throat: Oropharynx is clear and moist. No oropharyngeal exudate.  Eyes: Conjunctivae are normal. Right eye exhibits no discharge. Left eye exhibits no discharge.  Cardiovascular: Normal rate, regular rhythm and normal heart sounds.   Pulmonary/Chest:  Scattered rhonchi no wheezing bilateral lungs.   Lymphadenopathy:    He has no cervical adenopathy.  Neurological: He is alert and oriented to person, place, and time.  Psychiatric: He has a normal mood and affect. His behavior is normal.          Assessment & Plan:  Marland Kitchen.Marland Kitchen.Diagnoses and all orders for this visit:  SOB (shortness of breath) -     AMBULATORY NON FORMULARY MEDICATION; Medication Name: Nebulizer with tubing and supplies for Asthma.  I belive he uses [employer] for his oxygen.  Albuterol 2.5mg /253ml neb solution.  Given Q 6 hours PRN SOB or wheezing.  #100 with 3 RF. -     predniSONE (DELTASONE) 20 MG tablet; Take 3 tablets for 3 days, take 2 tablets for 3 days, take 1 tablet for 3 days, take 1/2 tablet for 4 days.  Cough -     AMBULATORY NON FORMULARY MEDICATION; Medication Name:  Nebulizer with tubing and supplies for Asthma.  I belive he uses [employer] for his oxygen.  Albuterol 2.5mg /643ml neb solution.  Given Q 6 hours PRN SOB or wheezing.  #100 with 3 RF. -     predniSONE (DELTASONE) 20 MG tablet; Take 3 tablets for 3 days, take 2 tablets for 3 days, take 1 tablet for 3 days, take 1/2 tablet for 4 days.  Severe persistent asthma with acute exacerbation -     AMBULATORY NON FORMULARY MEDICATION; Medication Name: Nebulizer with tubing and supplies for Asthma.  I belive he uses [employer] for his oxygen.  Albuterol 2.5mg /483ml neb solution.  Given Q 6 hours PRN SOB or wheezing.  #100 with 3 RF. -     predniSONE (DELTASONE) 20 MG tablet; Take 3 tablets for 3 days, take 2 tablets for 3 days, take 1 tablet for 3 days, take 1/2 tablet for 4 days.   Seems to be doing better. Given oral prednisone due to persistent rhonchi and SOB. Pt is much better. Finish levaquin. Resent nebulizer to right company. Hopefully can start nebs at least 3 times a day when SOB. Pulse ox improving to 94 percent. Vitals great. Continue on mucinex.

## 2016-06-12 DIAGNOSIS — F251 Schizoaffective disorder, depressive type: Secondary | ICD-10-CM | POA: Diagnosis not present

## 2016-06-12 DIAGNOSIS — J4551 Severe persistent asthma with (acute) exacerbation: Secondary | ICD-10-CM | POA: Insufficient documentation

## 2016-06-14 DIAGNOSIS — G4733 Obstructive sleep apnea (adult) (pediatric): Secondary | ICD-10-CM | POA: Diagnosis not present

## 2016-06-19 ENCOUNTER — Other Ambulatory Visit: Payer: Self-pay | Admitting: *Deleted

## 2016-06-19 MED ORDER — ALBUTEROL SULFATE (2.5 MG/3ML) 0.083% IN NEBU: 2.5000 mg | INHALATION_SOLUTION | Freq: Four times a day (QID) | RESPIRATORY_TRACT | 1 refills | Status: AC | PRN

## 2016-06-22 ENCOUNTER — Other Ambulatory Visit: Payer: Self-pay | Admitting: Physician Assistant

## 2016-06-22 HISTORY — PX: TRANSTHORACIC ECHOCARDIOGRAM: SHX275

## 2016-07-04 ENCOUNTER — Ambulatory Visit (HOSPITAL_BASED_OUTPATIENT_CLINIC_OR_DEPARTMENT_OTHER)
Admission: RE | Admit: 2016-07-04 | Discharge: 2016-07-04 | Disposition: A | Discharge status: Home / Self Care | Payer: PPO | Source: Ambulatory Visit | Attending: Physician Assistant | Admitting: Physician Assistant

## 2016-07-04 DIAGNOSIS — E669 Obesity, unspecified: Secondary | ICD-10-CM | POA: Diagnosis not present

## 2016-07-04 DIAGNOSIS — R0600 Dyspnea, unspecified: Secondary | ICD-10-CM

## 2016-07-04 DIAGNOSIS — M7989 Other specified soft tissue disorders: Secondary | ICD-10-CM

## 2016-07-04 DIAGNOSIS — I2510 Atherosclerotic heart disease of native coronary artery without angina pectoris: Secondary | ICD-10-CM

## 2016-07-04 LAB — ECHOCARDIOGRAM COMPLETE
FS: 29 % (ref 28–44)
IV/PV OW: 0.85
LA diam end sys: 38 mm
LADIAMINDEX: 1.5 cm/m2
LASIZE: 38 mm
LAVOL: 61.8 mL
LAVOLA4C: 57.7 mL
LAVOLIN: 24.4 mL/m2
LV PW d: 13.6 mm — AB (ref 0.6–1.1)
LVOT area: 5.31 cm2
LVOT diameter: 26 mm

## 2016-07-04 NOTE — Progress Notes (Signed)
  Echocardiogram 2D Echocardiogram has been performed.  Ryan Berg, [name] 07/04/2016, 1:50 PM

## 2016-07-06 ENCOUNTER — Telehealth: Payer: Self-pay | Admitting: *Deleted

## 2016-07-06 ENCOUNTER — Encounter: Payer: Self-pay | Admitting: Physician Assistant

## 2016-07-06 DIAGNOSIS — R931 Abnormal findings on diagnostic imaging of heart and coronary circulation: Secondary | ICD-10-CM

## 2016-07-06 DIAGNOSIS — I5042 Chronic combined systolic (congestive) and diastolic (congestive) heart failure: Secondary | ICD-10-CM | POA: Insufficient documentation

## 2016-07-06 DIAGNOSIS — G4733 Obstructive sleep apnea (adult) (pediatric): Secondary | ICD-10-CM | POA: Diagnosis not present

## 2016-07-06 NOTE — Telephone Encounter (Signed)
referral

## 2016-07-06 NOTE — Progress Notes (Signed)
Call pt: there is some decrease in ejection fraction indication systolic heart failure. I am going to send you to cardiology. Are you ok with this?

## 2016-07-10 ENCOUNTER — Other Ambulatory Visit: Payer: Self-pay | Admitting: Physician Assistant

## 2016-07-12 DIAGNOSIS — G4733 Obstructive sleep apnea (adult) (pediatric): Secondary | ICD-10-CM | POA: Diagnosis not present

## 2016-07-15 DIAGNOSIS — G4733 Obstructive sleep apnea (adult) (pediatric): Secondary | ICD-10-CM | POA: Diagnosis not present

## 2016-07-23 ENCOUNTER — Ambulatory Visit (INDEPENDENT_AMBULATORY_CARE_PROVIDER_SITE_OTHER): Payer: PPO | Admitting: Physician Assistant

## 2016-07-23 ENCOUNTER — Encounter: Payer: Self-pay | Admitting: Physician Assistant

## 2016-07-23 VITALS — BP 112/72 | HR 76 | Ht 71.0 in | Wt 309.0 lb

## 2016-07-23 DIAGNOSIS — R0609 Other forms of dyspnea: Secondary | ICD-10-CM

## 2016-07-23 DIAGNOSIS — I2510 Atherosclerotic heart disease of native coronary artery without angina pectoris: Secondary | ICD-10-CM | POA: Diagnosis not present

## 2016-07-23 DIAGNOSIS — I429 Cardiomyopathy, unspecified: Secondary | ICD-10-CM | POA: Diagnosis not present

## 2016-07-23 NOTE — Patient Instructions (Signed)
Medication Instructions:  None   Labwork: None   Testing/Procedures: Cardiac CT with FFR  Follow-Up: Your physician recommends that you schedule a follow-up appointment in: 1st available with Dr Herbie BaltimoreHarding    Any Other Special Instructions Will Be Listed Below (If Applicable).  If you need a refill on your cardiac medications before your next appointment, please call your pharmacy.

## 2016-07-23 NOTE — Progress Notes (Signed)
Cardiology Office Note   Date:  07/23/2016   ID:  Ryan Neererry Mothershead, DOB 07/27/1945, MRN [id number]  PCP:  Jomarie LongsBreeback, Jade L, PA-C  Cardiologist:  New, will be Dr Lauraine RinneHarding Tashan Kreitzer, PA-C   Chief Complaint  Patient presents with  . Follow-up  . Shortness of Breath  . Edema    Hands.    History of Present Illness: Ryan Berg is a 71 y.o. male with a history of persistent asthma, bipolar, COPD on home O2, OSA on CPAP  Echo ordered by PCP for SOB, EF was 45-50%, cards referral made.  Ryan Neererry Polich presents for cardiology evaluation. He lives with his sister.  No hx per pt/sister of CAD, HTN, HLD, DM. However, initial visit by Tandy GawJade Breeback, PA-C 04/2015 (in media) lists CAD, HTN and HLD among dx. He was on Plavix at that time. He was in [facility] in [place], [place] for 3 weeks 08/2015 for depression. The d/c summary lists cerebral hemorrhage after a fall, HTN, HLD, LBBB on the dx list from the admission. Sister says records were sent from [place] and scanned in.  He does not exercise, exerts himself very little. [place] [employer], but quit because he got SOB doing that. He is not very consistent with the CPAP but benefits from it when he uses it. He is not sure about PND because of the CPAP. His feels like his hands swell frequently. His legs are not swollen when he wakes up. He does get some swelling during the day. He sleeps on 2 pillows, no recent change.   He uses O2 most of the time, will take it off when he is sitting still.   He never gets chest pain. No palpitations. Never gets light-headed. Gets weak, unable to tell when he gets weak or what makes him weak. It may be with exertion.   He is anxious about his heart being weak.   His diet is poor, eats a lot of hamburgers. When he moved here from [place] in 2017, he weighed about 260. He was put on a psych med that made him gain weight. He gets constipated, denies other GI issues.   Past  Medical History:  Diagnosis Date  . Bipolar affective (HCC)   . 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 COPD (chronic obstructive pulmonary disease) (HCC), home O2 at 2 lpm   . OSA on CPAP   . Persistent asthma     Past Surgical History:  Procedure Laterality Date  . APPENDECTOMY    . HERNIA REPAIR      Current Outpatient Prescriptions  Medication Sig Dispense Refill  . albuterol (PROVENTIL HFA;VENTOLIN HFA) 108 (90 Base) MCG/ACT inhaler Inhale 2 puffs into the lungs every 6 (six) hours as needed for wheezing or shortness of breath. 1 Inhaler 2  . albuterol (PROVENTIL) (2.5 MG/3ML) 0.083% nebulizer solution Take 3 mLs (2.5 mg total) by nebulization every 6 (six) hours as needed for wheezing or shortness of breath. 150 mL 1  . AMBULATORY NON FORMULARY MEDICATION CPAP and supplies set on auto range. 1 each 0  . AMBULATORY NON FORMULARY MEDICATION Due to chronic asthma and hypoxemia pulse ox at rest on room air was 83 percent and needs 2L continuous O2 and portable. 1 Device 0  . AMBULATORY NON FORMULARY MEDICATION Medication Name: Nebulizer with tubing and supplies for Asthma.  I belive he uses [employer] for his oxygen.  Albuterol 2.5mg /433ml neb  solution.  Given Q 6 hours PRN SOB or wheezing.  #100 with 3 RF. 1 vial 0  . atorvastatin (LIPITOR) 10 MG tablet Take 1 tablet (10 mg total) by mouth daily at 6 PM. 90 tablet 3  . clopidogrel (PLAVIX) 75 MG tablet TAKE 1 TABLET BY MOUTH EVERY DAY 31 tablet 5  . divalproex (DEPAKOTE) 500 MG DR tablet Take by mouth.    . DOK 100 MG capsule TAKE 1 CAPSULE BY MOUTH 2 TIMES DAILY 60 capsule 5  . fluticasone (FLONASE) 50 MCG/ACT nasal spray Place into the nose.    Marland Kitchen Fluticasone-Umeclidin-Vilant (TRELEGY ELLIPTA) 100-62.5-25 MCG/INH AEPB Inhale 1 puff into the lungs daily. 1 each 3  . haloperidol decanoate (HALDOL DECANOATE) 50 MG/ML injection Inject into the muscle.    . hydrochlorothiazide (MICROZIDE) 12.5 MG capsule  TAKE 1 CAPSULE BY MOUTH DAILY AS NEEDED FOR SWELLING 30 capsule 3  . metoprolol tartrate (LOPRESSOR) 25 MG tablet Take 1 tablet (25 mg total) by mouth 2 (two) times daily. 60 tablet 3  . montelukast (SINGULAIR) 10 MG tablet Take 1 tablet (10 mg total) by mouth at bedtime. 30 tablet 11  . polyethylene glycol (MIRALAX / GLYCOLAX) packet Take 17 g by mouth 2 (two) times daily. Until stooling regularly 30 packet 11  . predniSONE (DELTASONE) 20 MG tablet Take 3 tablets for 3 days, take 2 tablets for 3 days, take 1 tablet for 3 days, take 1/2 tablet for 4 days. 20 tablet 0  . senna-docusate (SENOKOT-S) 8.6-50 MG tablet Take 2 tablets by mouth 2 (two) times daily. Until stooling regularly 60 tablet 0  . haloperidol (HALDOL) 10 MG tablet Take by mouth.    . mirtazapine (REMERON) 30 MG tablet Take by mouth.     No current facility-administered medications for this visit.     Allergies:   Patient has no known allergies.    Social History:  The patient  reports that he quit smoking about 26 years ago. His smoking use included Cigarettes. He has never used smokeless tobacco. He reports that he drinks alcohol.   Family History:  The patient's family history includes Heart disease (age of onset: 16) in his mother; Heart disease (age of onset: 65) in his father; Hypertension in his father and mother.    ROS:  Please see the history of present illness. All other systems are reviewed and negative.    PHYSICAL EXAM: VS:  BP 112/72   Pulse 76   Ht 5\' 11"  (1.803 m)   Wt (!) 309 lb (140.2 kg)   BMI 43.10 kg/m  , BMI Body mass index is 43.1 kg/m. GEN: Well nourished, morbidly obese, male in no acute distress  HEENT: normal for age  Neck: no JVD seen but difficult to assess 2nd body habitus, no carotid bruit, no masses Cardiac: RRR; no murmur, no rubs, or gallops Respiratory:  clear to auscultation bilaterally, normal work of breathing GI: soft, nontender, nondistended, + BS MS: no deformity or  atrophy; no edema; distal pulses are 2+ in all 4 extremities   Skin: warm and dry, no rash; skin is dry and scaly Neuro:  Strength and sensation are intact Psych: euthymic mood, flat affect, may be due to rx   EKG:  EKG is ordered today. 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 The cavity size was normal.   Wall thickness was increased in a pattern  of mild LVH. Systolic   function was mildly reduced. The estimated ejection fraction was   in the range of 45% to 50%. Diffuse hypokinesis. The study is not   technically sufficient to allow evaluation of LV diastolic function. - Aorta: Aortic root dimension: 39 mm (ED). - Ascending aorta: The ascending aorta was mildly dilated.   Recent Labs: 06/03/2016: Brain Natriuretic Peptide <4 06/08/2016: ALT 23; BUN 19; Creat 0.93; Hemoglobin 14.6; Platelets 237; Potassium 4.0; Sodium 138    Lipid Panel No results found for: CHOL, TRIG, HDL, CHOLHDL, VLDL, LDLCALC, LDLDIRECT   Wt Readings from Last 3 Encounters:  07/23/16 (!) 309 lb (140.2 kg)  06/08/16 (!) 306 lb (138.8 kg)  06/03/16 (!) 309 lb (140.2 kg)     Other studies Reviewed: Additional studies/ records that were reviewed today include: office notes and testing.  ASSESSMENT AND PLAN: The patient and the plan were reviewed with Dr Herbie Baltimore who agrees  1.  LVD: No clear evidence of CHF by exam. BNP was <4 when checked 05/2016. BP is well-controlled. He is not on ASA, but is on Plavix. He is on BB and statin.   2. Possible CAD: Listed on initial eval by Verna Czech, PAC. No further details available at this time. Needs eval for CAD and to determine if ICM or NICM. Ck Cardiac CT w/ FFR. F/u on results. Renal function was ok on CMET 05/2016. (no hx renal insuff)  3. OSA: encouraged compliance w/ CPAP, advised that not using it puts strain on his heart.  Current medicines are reviewed  at length with the patient today.  The patient does not have concerns regarding medicines.  The following changes have been made:  no change  Labs/ tests ordered today include:   Orders Placed This Encounter  Procedures  . CT COROARY FRACTIONAL FLOW RESERVE (FFR)  . EKG 12-Lead     Disposition:   FU with Dr Herbie Baltimore  Signed, Theodore Demark, PA-C  07/23/2016 5:26 PM    [facility] Phone: [phone]; Fax: [phone]  This note was written with the assistance of speech recognition software. Please excuse any transcriptional errors.

## 2016-07-26 ENCOUNTER — Telehealth: Payer: Self-pay | Admitting: Cardiology

## 2016-07-26 NOTE — Telephone Encounter (Signed)
[facility] as requested by Dr Herbie BaltimoreHarding.  Records put with Dr Elissa HeftyHarding's schedule for 08/27/16. lp

## 2016-08-05 DIAGNOSIS — G4733 Obstructive sleep apnea (adult) (pediatric): Secondary | ICD-10-CM | POA: Diagnosis not present

## 2016-08-14 ENCOUNTER — Encounter: Payer: Self-pay | Admitting: Cardiology

## 2016-08-14 DIAGNOSIS — G4733 Obstructive sleep apnea (adult) (pediatric): Secondary | ICD-10-CM | POA: Diagnosis not present

## 2016-08-15 ENCOUNTER — Telehealth: Payer: Self-pay | Admitting: Cardiology

## 2016-08-15 DIAGNOSIS — Z79899 Other long term (current) drug therapy: Secondary | ICD-10-CM

## 2016-08-15 NOTE — Telephone Encounter (Signed)
Pt is there,needs a lab order fax asap to [phone].

## 2016-08-15 NOTE — Telephone Encounter (Signed)
Bjorn Loserhonda, PA ordered cardiac CT. BMET needs to be done prior. Lab order placed.

## 2016-08-16 LAB — BASIC METABOLIC PANEL
BUN / CREAT RATIO: 16 (ref 10–24)
BUN: 15 mg/dL (ref 8–27)
CALCIUM: 9.4 mg/dL (ref 8.6–10.2)
CO2: 25 mmol/L (ref 20–29)
Chloride: 103 mmol/L (ref 96–106)
Creatinine, Ser: 0.91 mg/dL (ref 0.76–1.27)
GFR calc non Af Amer: 85 mL/min/{1.73_m2} (ref >59)
GFR, EST AFRICAN AMERICAN: 98 mL/min/{1.73_m2} (ref >59)
Glucose: 134 mg/dL — ABNORMAL HIGH (ref 65–99)
POTASSIUM: 4.9 mmol/L (ref 3.5–5.2)
SODIUM: 143 mmol/L (ref 134–144)

## 2016-08-24 ENCOUNTER — Encounter (HOSPITAL_COMMUNITY): Payer: Self-pay

## 2016-08-24 ENCOUNTER — Ambulatory Visit (HOSPITAL_COMMUNITY)
Admission: RE | Admit: 2016-08-24 | Discharge: 2016-08-24 | Disposition: A | Discharge status: Home / Self Care | Payer: PPO | Source: Ambulatory Visit | Attending: Physician Assistant | Admitting: Physician Assistant

## 2016-08-24 ENCOUNTER — Ambulatory Visit (HOSPITAL_COMMUNITY): Admission: RE | Admit: 2016-08-24 | Payer: PPO | Source: Ambulatory Visit

## 2016-08-24 ENCOUNTER — Other Ambulatory Visit: Payer: Self-pay | Admitting: *Deleted

## 2016-08-24 DIAGNOSIS — I700 Atherosclerosis of aorta: Secondary | ICD-10-CM | POA: Insufficient documentation

## 2016-08-24 DIAGNOSIS — I429 Cardiomyopathy, unspecified: Secondary | ICD-10-CM

## 2016-08-24 DIAGNOSIS — J438 Other emphysema: Secondary | ICD-10-CM | POA: Insufficient documentation

## 2016-08-24 DIAGNOSIS — J432 Centrilobular emphysema: Secondary | ICD-10-CM | POA: Insufficient documentation

## 2016-08-24 DIAGNOSIS — I2510 Atherosclerotic heart disease of native coronary artery without angina pectoris: Secondary | ICD-10-CM | POA: Insufficient documentation

## 2016-08-24 IMAGING — CT CT HEART MORP W/ CTA COR W/ SCORE W/ CA W/CM &/OR W/O CM
4 of 7 series · 8 of 20 positions shown, 9 images · IV contrast (APPLIED)
Comparison: Chest CT 03/28/2016.

CLINICAL DATA: 71-year-old male with cardiomyopathy of unknown
etiology, Evaluate for CAD.

EXAM:
Cardiac/Coronary  CT
TECHNIQUE: The patient was scanned on a Siemens Force Scanner.

[Series 6: best diast 67 % · axial · 0.43mm/px · z∈[+1181,+1231]mm · 2 of 372 slices shown, 3 images]
[im 124/372  vessel]
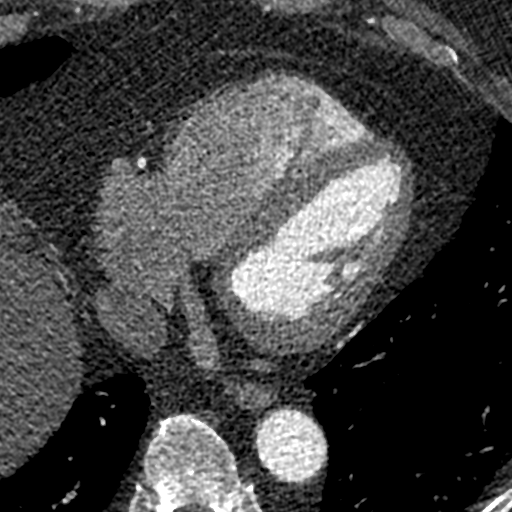
[im 124/372  lung]
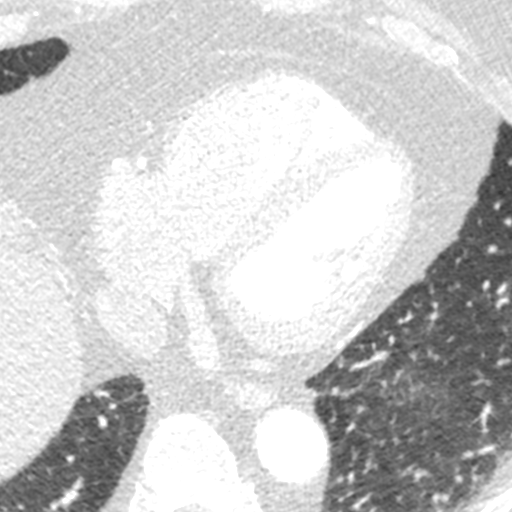
[im 248/372  vessel]
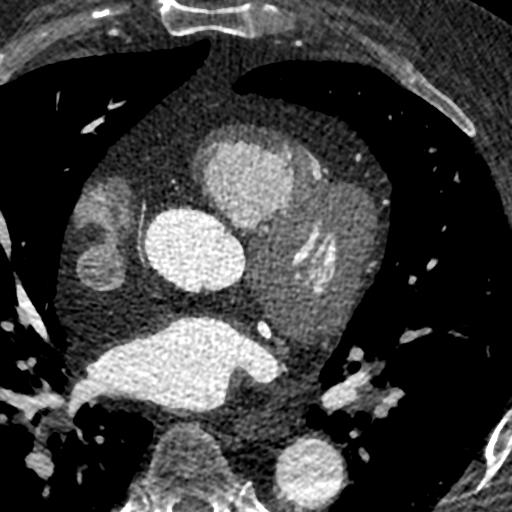

[Series 7: best syst 37 % · axial · 0.43mm/px · z∈[+1181,+1231]mm · 2 of 372 slices shown]
[im 124/372  vessel]
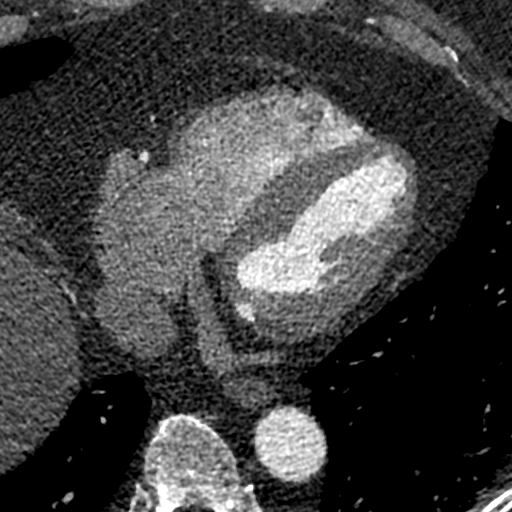
[im 248/372  vessel]
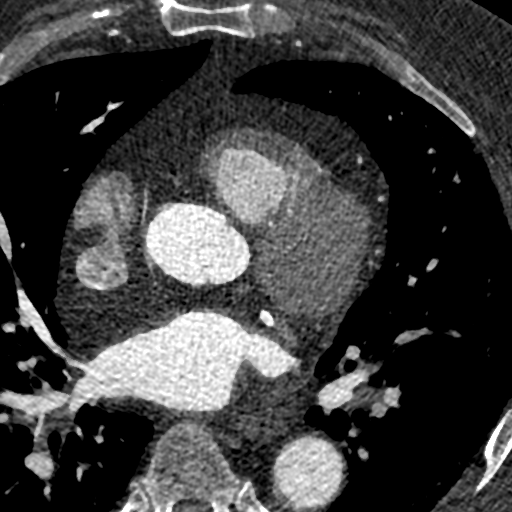

[Series 8: ts diast sharp 67 % · axial · 0.43mm/px · z∈[+1181,+1231]mm · 2 of 372 slices shown]
[im 124/372  lung]
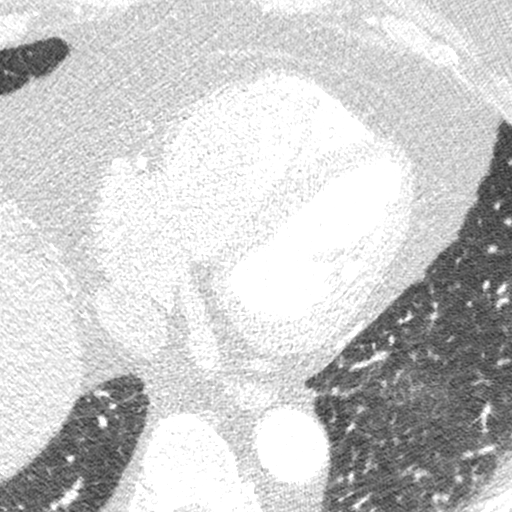
[im 248/372  lung]
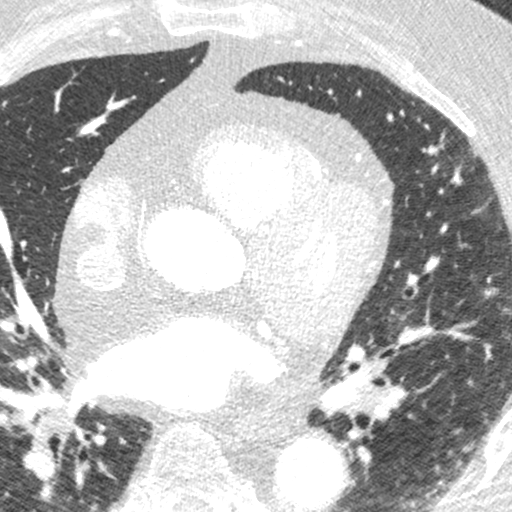

[Series 9: ts syst sharp 37 % · axial · 0.43mm/px · z∈[+1181,+1231]mm · 2 of 372 slices shown]
[im 124/372  lung]
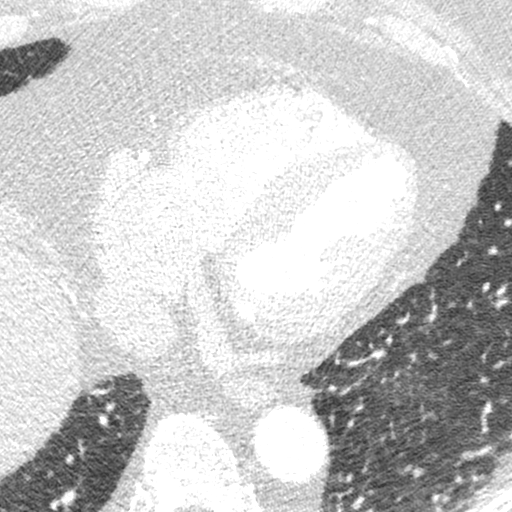
[im 248/372  lung]
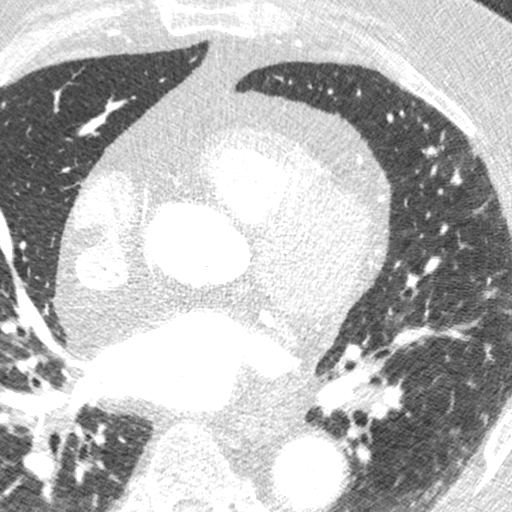

[8 of 20 positions shown; findings below may reference images not displayed]

FINDINGS: A 100 kV prospective scan was triggered in the descending thoracic
aorta at 111 HU's. Axial non-contrast 3 mm slices were carried out
through the heart. The data set was analyzed on a dedicated work
station and scored using the Agatson method. Gantry rotation speed
was 250 msecs and collimation was 0.6 mm. No beta blockade and
mg of sl NTG was given. The 3D data set was reconstructed in 5%
intervals of the 67-82 % of the R-R cycle. Diastolic phases were
analyzed on a dedicated work station using MPR, MIP and VRT modes.
The patient received 80 cc of contrast.

Aorta:  Normal size.  No calcifications.  No dissection.

Aortic Valve:  Trileaflet.  No calcifications.

Coronary Arteries:  Normal coronary origin.  Right dominance.

RCA is a large dominant artery that gives rise to PDA and PLVB.
There is severe diffuse mixed plaque in the proximal and mid RCA
associated with at least 50-69 but possibly > 70% stenosis.

Left main is a large artery that gives rise to LAD and LCX arteries.
LM has mild calcified plaque in the distal portion of the artery
associated with 25-50% stenosis.

LAD is a medium size vessel that gives rise two diagonal branches.
LAD has a moderate mixed plaque in its proximal to mid segment
associated with 50-69% stenosis. After this portion the LAD lumen is
very small.

LCX is a medium size non-dominant artery that has diffuse calcified
plaque associated with 25-50% stenosis.

Other findings:

Normal pulmonary vein drainage into the left atrium.

Normal let atrial appendage without a thrombus.
IMPRESSION: 1. Coronary calcium score of 5260. This was 90 percentile for age
and sex matched control.

2. Normal coronary origin with right dominance.

3. Diffuse plaque, at least 50-69%, but possible > 70% stenosis in
the proximal and mid RCA and in the mid LAD. Additional analysis
with CT FFR will be performed.

Jadiel Harder

EXAM:
OVER-READ INTERPRETATION  CT CHEST

The following report is an over-read performed by radiologist Dr.
over-read does not include interpretation of cardiac or coronary
anatomy or pathology. The coronary calcium score/coronary CTA
interpretation by the cardiologist is attached.
FINDINGS: Aortic atherosclerosis. Mild diffuse bronchial wall thickening with
mild to moderate centrilobular and mild paraseptal emphysema. Within
the visualized portions of the thorax there are no suspicious
appearing pulmonary nodules or masses, there is no acute
consolidative airspace disease, no pleural effusions, no
pneumothorax and no lymphadenopathy (borderline enlarged right hilar
lymph node is stable compared to the prior study and nonspecific).
Visualized portions of the upper abdomen are unremarkable. There are
no aggressive appearing lytic or blastic lesions noted in the
visualized portions of the skeleton.
IMPRESSION: 1. Mild diffuse bronchial wall thickening with mild to moderate
centrilobular and mild paraseptal emphysema; imaging findings
suggestive of underlying COPD.
2. Aortic atherosclerosis.
Aortic Atherosclerosis (TEV7U-TR4.4) and Emphysema (TEV7U-2UX.G).

## 2016-08-24 MED ORDER — IOPAMIDOL (ISOVUE-370) INJECTION 76%
INTRAVENOUS | Status: AC
  Administered 2016-08-24: 100 mL
  Filled 2016-08-24: qty 100

## 2016-08-24 MED ORDER — NITROGLYCERIN 0.4 MG SL SUBL
SUBLINGUAL_TABLET | SUBLINGUAL | Status: AC
  Administered 2016-08-24: 0.08 mg
  Filled 2016-08-24: qty 1

## 2016-08-24 MED ORDER — METOPROLOL TARTRATE 5 MG/5ML IV SOLN
INTRAVENOUS | Status: AC
  Administered 2016-08-24: 5 mg
  Filled 2016-08-24: qty 5

## 2016-08-24 MED ORDER — METOPROLOL TARTRATE 5 MG/5ML IV SOLN
INTRAVENOUS | Status: AC
  Filled 2016-08-24: qty 5

## 2016-08-27 ENCOUNTER — Encounter: Payer: Self-pay | Admitting: Cardiology

## 2016-08-27 ENCOUNTER — Ambulatory Visit (INDEPENDENT_AMBULATORY_CARE_PROVIDER_SITE_OTHER): Payer: PPO | Admitting: Cardiology

## 2016-08-27 VITALS — BP 116/60 | HR 66 | Ht 71.0 in | Wt 322.0 lb

## 2016-08-27 DIAGNOSIS — I5042 Chronic combined systolic (congestive) and diastolic (congestive) heart failure: Secondary | ICD-10-CM

## 2016-08-27 DIAGNOSIS — J432 Centrilobular emphysema: Secondary | ICD-10-CM

## 2016-08-27 DIAGNOSIS — R0609 Other forms of dyspnea: Secondary | ICD-10-CM

## 2016-08-27 DIAGNOSIS — J984 Other disorders of lung: Secondary | ICD-10-CM | POA: Diagnosis not present

## 2016-08-27 DIAGNOSIS — Z01818 Encounter for other preprocedural examination: Secondary | ICD-10-CM | POA: Diagnosis not present

## 2016-08-27 DIAGNOSIS — R931 Abnormal findings on diagnostic imaging of heart and coronary circulation: Secondary | ICD-10-CM | POA: Diagnosis not present

## 2016-08-27 DIAGNOSIS — D689 Coagulation defect, unspecified: Secondary | ICD-10-CM

## 2016-08-27 DIAGNOSIS — E785 Hyperlipidemia, unspecified: Secondary | ICD-10-CM | POA: Diagnosis not present

## 2016-08-27 DIAGNOSIS — I2510 Atherosclerotic heart disease of native coronary artery without angina pectoris: Secondary | ICD-10-CM | POA: Diagnosis not present

## 2016-08-27 NOTE — Assessment & Plan Note (Signed)
Last lipids were LAST year. He is due now. I'll increase his atorvastatin to 20 mg at this point, and then if not checked by his PCP in the next month or 2, we can check here.

## 2016-08-27 NOTE — Assessment & Plan Note (Signed)
On the concern with a reduced EF these gained quite a bit of weight. It does not appear to be fluid weight, however we cannot be sure. He is profoundly dyspneic and difficult to get a story from him. He is not on a diuretic.  Since he does have significant obstructive lung disease and coronary disease, we need to confirm or deny the presence of true volume overload. Plan: We will do right heart cath along with left heart catheterization for volume assessment etc.

## 2016-08-27 NOTE — Assessment & Plan Note (Signed)
Multifactorial. Probably related to COPD and restrictive lung disease, but cannot exclude an ischemic component..  Plan: Continue home oxygen. Right and left heart cath to assess pulmonary pressures as well as coronary anatomy.

## 2016-08-27 NOTE — Assessment & Plan Note (Signed)
Followed by pulmonary medicine along with COPD. Defer management to them. Plan: Add right heart cath to Left heart catheterization

## 2016-08-27 NOTE — Assessment & Plan Note (Signed)
Reportedly, history of PCI, but unknown territory. He does have 2 vessel coronary disease by coronary CTA with significant lesion noted in the RCA and moderate lesion in the LAD. CT FFR was positive with FFR of 0.66 in the RCA indicating physiologic significant disease in the RCA. Based on reduced EF on echocardiogram and progressive dyspnea, we discussed the best course of action going forward.  I think given his risk factors of hyperlipidemia, obesity, and hypertension as well as a known history of CAD, the best course of action we proceed with right and left heart catheterization. This will allow us to anatomically evaluate his coronary arteries but also measure left ventricular filling pressures as well as right heart cath pressures

## 2016-08-27 NOTE — Assessment & Plan Note (Signed)
CT FFR negative LAD lesion but significant positive RCA lesion. Plan is to proceed with coronary angiography with right left heart catheterization plus minus PCI.

## 2016-08-27 NOTE — Patient Instructions (Signed)
   [facility] [address] [place] [address] Dept: [phone] Loc: [phone]  Ryan Berg  08/27/2016  You are scheduled for a RIGHT AND LEFT Cardiac Catheterization on Thursday, August 9 with Dr. Bryan Lemmaavid Harding.  1. [facility] at [facility]: [address] [name], [place] [address] at 10:00 AM (two hours before your procedure to ensure your preparation). Free valet parking service is available.   Special note: Every effort is made to have your procedure done on time. Please understand that emergencies sometimes delay scheduled procedures.  2. Diet: Do not eat or drink anything after midnight prior to your procedure except sips of water to take medications.  3. Labs: TODAY -- [facility] -- BMP,CBC PT/INR  4. Medication instructions in preparation for your procedure:      On the morning of your procedure, take your Aspirin  81 MG, and any morning medicines NOT listed above.  You may use sips of water.  5. Plan for one night stay--bring personal belongings. 6. Bring a current list of your medications and current insurance cards. 7. You MUST have a responsible person to drive you home. 8. Someone MUST be with you the first 24 hours after you arrive home or your discharge will be delayed. 9. Please wear clothes that are easy to get on and off and wear slip-on shoes.  Thank you for allowing us to care for you!   -- [facility]   Your physician recommends that you schedule a follow-up appointment in 2 WEEKS WITH DR HARDING.

## 2016-08-27 NOTE — Assessment & Plan Note (Signed)
Probably the biggest reason for his dyspnea, however with FFR positive lesion in the RCA, we are necessarily obligated to evaluate with cardiac catheterization. In order to determine the pulmonary pressures and RV pressures, we will opt for right and left heart catheterization as opposed to simply left heart cath.

## 2016-08-27 NOTE — Progress Notes (Signed)
PCP: Jomarie Longs, PA-C  Clinic Note: Chief Complaint  Patient presents with  . Follow-up    follow up from testing coronary CTA-FFR  . Cardiomyopathy    HPI: Math Brazie is a 71 y.o. 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 There is somewhat confusion as his past medical history since we have not been getting records from [place]. According to his sister, his POA, she was told that he had a stent done a couple years ago. The patient himself has no recollection. He is on aspirin and Plavix. He has severe restrictive lung disease with FEV1 of 47%, FVC 50% and ratio 73%. He is on home oxygen. He intermittently uses CPAP for OSA although he tells me today he doesn't use it.  He has schizoaffective disorder, bipolar type.   Khizar Fiorella was initially seen on 07/23/2016 by Theodore Demark, PA-C. This was in follow-up of an echocardiogram that showed EF of 45-50%. (Questionable history of CAD - sister indicates h/p PCI, hypertension and hyperlipidemia). Suffered a cerebral hemorrhage after a fall in August 2017. She did not hospitalizations indicate hypertension, hyperlipidemia left bundle-branch block). - When he saw her on that he was noting exertional dyspnea simply walking to the mailbox. He has OSA, is not consistent wearing CPAP. He noted hand swelling as well as some evening leg swelling. He states on 2 pillows. He does use home oxygen most the time but doesn't use it while sitting still. - As a side note, Bjorn Loser noted that his diet is poor, eats a lot of hamburgers. When he moved here from [place] in 2017, he weighed about 260. He was put on a psych med that made him gain weight.   Recent Hospitalizations: None recently  Studies Personally Reviewed - (if available, images/films reviewed: From Epic Chart or Care Everywhere)  2-D  [facility] [place] March March 2018: Normal LV size and function. Normal RV size and  function. No poor cardiac effusion.(Care Everywhere, from [facility])   2-D Echo 07/04/2016: Mild LVH - EF 45-50% with Diffuse Hypokinesis. Unable to determine diastolic function. Mildly dilated ascending aorta.  Coronary CTA-CT FFR: Cardiac calcium score 1425. Diffuse plaque at least 50-69%. Possible greater than 70% stenosis in the proximal and mid RCA and mid LAD - FFR : LAD FFR 0.89. Proximal-mid RCA FFR 0.66.;   noncardiac findings: Mild diffuse bronchial wall thickening with mild to moderate centrilobular and mild paraseptal emphysema suggestive of underlying COPD. Also noted aortic atherosclerosis.   Interval History: Ly presents here today for follow-up after CT FFR finding to discuss the results. He is an obese gentleman with severe lung disease on oxygen who is extremely dyspneic simply walking back to the exam room. He denies any chest tightness or pressure with ambulation or activity. He does not use CPAP, but does use oxygen at nighttime. He has some mild hand and ankle swelling, no significant edema. No real PND or orthopnea as long as he has oxygen on. His major symptom he notes is the dyspnea, he denies any symptoms of palpitations or irregular heartbeats syncope or near syncope. No TIA or amaurosis fugax symptoms. As long as he is sitting still, he has no breathing issues, but as his torso to any exertion he gets profoundly dyspneic.  He doesn't recall ever having any chest discomfort at the time of his prior stent placement either. His symptoms always been dyspnea. He does again  acknowledged that he enjoys his hamburgers and other fast food items. No melena, hematochezia, hematuria, or epstaxis. No claudication.  ROS: A comprehensive was performed. Review of Systems  Constitutional: Positive for malaise/fatigue (Mostly because he is short of breath doing things). Negative for chills, fever and weight loss (He has actually gained weight).  HENT: Negative for congestion and  nosebleeds.   Eyes: Negative for blurred vision.  Respiratory: Positive for cough (Only productive in the morning), shortness of breath and wheezing.   Cardiovascular:       Per history of present illness  Gastrointestinal: Negative for abdominal pain, blood in stool, heartburn and melena.  Genitourinary: Negative for hematuria.  Musculoskeletal: Positive for back pain and joint pain. Negative for falls.  Psychiatric/Behavioral: Positive for memory loss (Not sure if his memory loss or just poor recollection in general). Negative for hallucinations. The patient is nervous/anxious and has insomnia.        He seems relatively stable at present on his psychotropic agents.  All other systems reviewed and are negative.    I have reviewed and (if needed) personally updated the patient's problem list, medications, allergies, past medical and surgical history, social and family history.   Past Medical History:  Diagnosis Date  . Bipolar affective (HCC)   . Centrilobular emphysema (HCC)     Mild diffuse bronchial wall thickening with mild to moderate centrilobular and mild paraseptal emphysema suggestive of underlying COPD.  Marland Kitchen. 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 COPD (chronic obstructive pulmonary disease) (HCC), home O2 at 2 lpm   . OSA on CPAP   . Persistent asthma   . Thoracic aortic atherosclerosis (HCC)    noted on CT    Past Surgical History:  Procedure Laterality Date  . APPENDECTOMY    . HERNIA REPAIR    . TRANSTHORACIC ECHOCARDIOGRAM  06/2016    Mild LVH - EF 45-50% with Diffuse Hypokinesis. Unable to determine diastolic function. Mildly dilated ascending aorta.    Current Meds  Medication Sig  . albuterol (PROVENTIL HFA;VENTOLIN HFA) 108 (90 Base) MCG/ACT inhaler Inhale 2 puffs into the lungs every 6 (six) hours as needed for wheezing or shortness of breath.  Marland Kitchen. albuterol (PROVENTIL) (2.5 MG/3ML) 0.083% nebulizer solution Take 3 mLs (2.5  mg total) by nebulization every 6 (six) hours as needed for wheezing or shortness of breath.  . AMBULATORY NON FORMULARY MEDICATION CPAP and supplies set on auto range.  . AMBULATORY NON FORMULARY MEDICATION Due to chronic asthma and hypoxemia pulse ox at rest on room air was 83 percent and needs 2L continuous O2 and portable.  . AMBULATORY NON FORMULARY MEDICATION Medication Name: Nebulizer with tubing and supplies for Asthma.  I belive he uses [employer] for his oxygen.  Albuterol 2.5mg /543ml neb solution.  Given Q 6 hours PRN SOB or wheezing.  #100 with 3 RF.  Marland Kitchen. atorvastatin (LIPITOR) 10 MG tablet Take 1 tablet (10 mg total) by mouth daily at 6 PM.  . clopidogrel (PLAVIX) 75 MG tablet TAKE 1 TABLET BY MOUTH EVERY DAY  . divalproex (DEPAKOTE) 500 MG DR tablet Take 500 mg by mouth daily.   Marland Kitchen. DOK 100 MG capsule TAKE 1 CAPSULE BY MOUTH 2 TIMES DAILY (Patient taking differently: TAKE 1 CAPSULE BY MOUTH 2 TIMES DAILY AS NEEDED FOR CONSTIPATION.)  . fluticasone (FLONASE) 50 MCG/ACT nasal spray Place 1 spray into the nose daily as needed (for allergies.).   Marland Kitchen. Fluticasone-Umeclidin-Vilant (TRELEGY ELLIPTA) 100-62.5-25 MCG/INH  AEPB Inhale 1 puff into the lungs daily.  . haloperidol (HALDOL) 10 MG tablet Take 10 mg by mouth 2 (two) times daily.   . haloperidol decanoate (HALDOL DECANOATE) 50 MG/ML injection Inject 50 mg into the muscle every 28 (twenty-eight) days.   . hydrochlorothiazide (MICROZIDE) 12.5 MG capsule TAKE 1 CAPSULE BY MOUTH DAILY AS NEEDED FOR SWELLING (Patient taking differently: TAKE 1 CAPSULE BY MOUTH DAILY)  . metoprolol tartrate (LOPRESSOR) 25 MG tablet Take 1 tablet (25 mg total) by mouth 2 (two) times daily. (Patient taking differently: Take 12.5 mg by mouth 2 (two) times daily. )  . mirtazapine (REMERON) 30 MG tablet Take 30 mg by mouth at bedtime.   . montelukast (SINGULAIR) 10 MG tablet Take 1 tablet (10 mg total) by mouth at bedtime.  . polyethylene glycol (MIRALAX / GLYCOLAX) packet  Take 17 g by mouth 2 (two) times daily. Until stooling regularly (Patient taking differently: Take 17 g by mouth 2 (two) times daily as needed (for constipation.). Until stooling regularly)  . predniSONE (DELTASONE) 20 MG tablet Take 3 tablets for 3 days, take 2 tablets for 3 days, take 1 tablet for 3 days, take 1/2 tablet for 4 days. (Patient not taking: Reported on 08/27/2016)  . senna-docusate (SENOKOT-S) 8.6-50 MG tablet Take 2 tablets by mouth 2 (two) times daily. Until stooling regularly (Patient taking differently: Take 2 tablets by mouth 2 (two) times daily as needed (for constipation.). Until stooling regularly)    No Known Allergies  Social History   Social History  . Marital status: Divorced    Spouse name: N/A  . Number of children: N/A  . Years of education: N/A   Occupational History  . Disabled    Social History Main Topics  . Smoking status: Former Smoker    Types: Cigarettes    Quit date: 01/22/1990  . Smokeless tobacco: Never Used  . Alcohol use Yes     Comment: rarely  . Drug use: Unknown  . Sexual activity: Not Asked   Other Topics Concern  . None   Social History Narrative   Pt lives with sister in an apartment. He moved here from [place] in June of 2017.    family history includes Heart disease (age of onset: 29) in his mother; Heart disease (age of onset: 79) in his father; Hypertension in his father and mother.  Wt Readings from Last 3 Encounters:  08/27/16 (!) 322 lb (146.1 kg)  07/23/16 (!) 309 lb (140.2 kg)  06/08/16 (!) 306 lb (138.8 kg)    PHYSICAL EXAM BP 116/60   Pulse 66   Ht 5\' 11"  (1.803 m)   Wt (!) 322 lb (146.1 kg)   BMI 44.91 kg/m   Short of breath simply walking into the clinic. Wears home oxygen Physical Exam  Constitutional: He appears well-developed and well-nourished. No distress (Not really distress, just short of breath).  Somewhat disheveled morbidly obese gentleman. Wearing home oxygen. Extremely dyspneic walking to the  exam room  HENT:  Head: Normocephalic and atraumatic.  Mouth/Throat: No oropharyngeal exudate.  Eyes: Pupils are equal, round, and reactive to light. Conjunctivae and EOM are normal.  Neck: Normal range of motion. Carotid bruit is not present.  Unable to assess JVD or HJR due to body habitus.  Cardiovascular: Normal rate and regular rhythm.   Occasional extrasystoles are present. PMI is not displaced (Unable to palpate).  Exam reveals distant heart sounds and decreased pulses. Exam reveals no gallop.   No murmur  heard. Pulmonary/Chest: Accessory muscle usage present. No respiratory distress. He has decreased breath sounds (With diffuse interstitial sounds and mild expiratory wheezing).  Abdominal: Soft. Bowel sounds are normal. He exhibits no distension. There is no hepatosplenomegaly (Unable to palpate because of obesity). There is no tenderness. There is no rebound and no CVA tenderness.  Obese. Unable to palpate HSM  Skin: He is diaphoretic.  Nursing note and vitals reviewed.    Adult ECG Report Not checked  Other studies Reviewed: Additional studies/ records that were reviewed today include:  Recent Labs:   Lab Results  Component Value Date   CREATININE 0.91 08/15/2016   BUN 15 08/15/2016   NA 143 08/15/2016   K 4.9 08/15/2016   CL 103 08/15/2016   CO2 25 08/15/2016   Last hemoglobin A1c recorded In Care Everywhere 08/25/2015: 5.7. Total Cholesterol 167, Triglycerides 210, HDL 38, LDL 87.    ASSESSMENT / PLAN: Problem List Items Addressed This Visit    Abnormal cardiac CT angiography    CT FFR negative LAD lesion but significant positive RCA lesion. Plan is to proceed with coronary angiography with right left heart catheterization plus minus PCI.      Relevant Orders   LEFT AND RIGHT HEART CATHETERIZATION WITH CORONARY ANGIOGRAM   CBC   Basic metabolic panel   Protime-INR   Centrilobular emphysema (HCC) (Chronic)    Probably the biggest reason for his dyspnea,  however with FFR positive lesion in the RCA, we are necessarily obligated to evaluate with cardiac catheterization. In order to determine the pulmonary pressures and RV pressures, we will opt for right and left heart catheterization as opposed to simply left heart cath.      Chronic combined systolic and diastolic heart failure (HCC) (Chronic)    On the concern with a reduced EF these gained quite a bit of weight. It does not appear to be fluid weight, however we cannot be sure. He is profoundly dyspneic and difficult to get a story from him. He is not on a diuretic.  Since he does have significant obstructive lung disease and coronary disease, we need to confirm or deny the presence of true volume overload. Plan: We will do right heart cath along with left heart catheterization for volume assessment etc.      Relevant Orders   LEFT AND RIGHT HEART CATHETERIZATION WITH CORONARY ANGIOGRAM   Coronary artery disease involving native coronary artery of native heart without angina pectoris - Primary (Chronic)    Reportedly, history of PCI, but unknown territory. He does have 2 vessel coronary disease by coronary CTA with significant lesion noted in the RCA and moderate lesion in the LAD. CT FFR was positive with FFR of 0.66 in the RCA indicating physiologic significant disease in the RCA. Based on reduced EF on echocardiogram and progressive dyspnea, we discussed the best course of action going forward.  I think given his risk factors of hyperlipidemia, obesity, and hypertension as well as a known history of CAD, the best course of action we proceed with right and left heart catheterization. This will allow [place] to anatomically evaluate his coronary arteries but also measure left ventricular filling pressures as well as right heart cath pressures      Relevant Orders   LEFT AND RIGHT HEART CATHETERIZATION WITH CORONARY ANGIOGRAM   Dyspnea (Chronic)    Multifactorial. Probably related to COPD and  restrictive lung disease, but cannot exclude an ischemic component..  Plan: Continue home oxygen. Right and left heart  cath to assess pulmonary pressures as well as coronary anatomy.      Relevant Orders   LEFT AND RIGHT HEART CATHETERIZATION WITH CORONARY ANGIOGRAM   CBC   Basic metabolic panel   Protime-INR   Hyperlipidemia with target low density lipoprotein (LDL) cholesterol less than 70 mg/dL (Chronic)    Last lipids were LAST year. He is due now. I'll increase his atorvastatin to 20 mg at this point, and then if not checked by his PCP in the next month or 2, we can check here.      Restrictive lung disease (Chronic)    Followed by pulmonary medicine along with COPD. Defer management to them. Plan: Add right heart cath to Left heart catheterization        Other Visit Diagnoses    Pre-op testing       Relevant Orders   CBC   Basic metabolic panel   Protime-INR   Clotting disorder [facility])       Relevant Orders   Protime-INR     Performing MD:  Bryan Lemma, M.D., M.S.  Procedure:  Right and Left Heart Catheterization with Coronary Angiography and Possible Percutaneous Coronary Intervention  The procedure with Risks/Benefits/Alternatives and Indications was reviewed with the patient and sister.  All questions were answered.    Risks / Complications include, but not limited to: Death, MI, CVA/TIA, VF/VT (with defibrillation), Bradycardia (need for temporary pacer placement), contrast induced nephropathy, bleeding / bruising / hematoma / pseudoaneurysm, vascular or coronary injury (with possible emergent CT or Vascular Surgery), adverse medication reactions, infection.  Additional risks involving the use of radiation with the possibility of radiation burns and cancer were explained in detail.  The patient and sister voice understanding and agree to proceed.      Current medicines are reviewed at length with the patient today. (+/- concerns) n/a The following changes have  been made: See below   Patient Instructions    [facility] CARDIOVASCULAR DIVISION [facility] [address] [place] [address] Dept: [phone] Loc: [phone]  Keon Pender  08/27/2016  You are scheduled for a RIGHT AND LEFT Cardiac Catheterization on Thursday, August 9 with Dr. Bryan Lemma.  1. Please arrive at the [facility] (Main Entrance A) at [facility]: [address] [place], [place] [address] at 10:00 AM (two hours before your procedure to ensure your preparation). Free valet parking service is available.   Special note: Every effort is made to have your procedure done on time. Please understand that emergencies sometimes delay scheduled procedures.  2. Diet: Do not eat or drink anything after midnight prior to your procedure except sips of water to take medications.  3. Labs: TODAY -- [facility] -- BMP,CBC PT/INR  4. Medication instructions in preparation for your procedure:      On the morning of your procedure, take your Aspirin  81 MG, and any morning medicines NOT listed above.  You may use sips of water.  5. Plan for one night stay--bring personal belongings. 6. Bring a current list of your medications and current insurance cards. 7. You MUST have a responsible person to drive you home. 8. Someone MUST be with you the first 24 hours after you arrive home or your discharge will be delayed. 9. Please wear clothes that are easy to get on and off and wear slip-on shoes.  Thank you for allowing [place] to care for you!   -- [place] [place]  [facility]   Your physician recommends that you schedule a follow-up appointment in 2 WEEKS WITH DR Mable Dara.    Studies Ordered:   Orders Placed This Encounter  Procedures  . CBC  . Basic metabolic panel  . Protime-INR  . LEFT AND RIGHT HEART CATHETERIZATION WITH CORONARY Jocelyn Lamer, M.D., M.S. Interventional  Cardiologist   Pager # [phone] Phone # [phone] [address] [place], [place] [address]

## 2016-08-28 ENCOUNTER — Telehealth: Payer: Self-pay

## 2016-08-28 LAB — BASIC METABOLIC PANEL
BUN/Creatinine Ratio: 20 (ref 10–24)
BUN: 19 mg/dL (ref 8–27)
CALCIUM: 9.7 mg/dL (ref 8.6–10.2)
CHLORIDE: 96 mmol/L (ref 96–106)
CO2: 21 mmol/L (ref 20–29)
Creatinine, Ser: 0.97 mg/dL (ref 0.76–1.27)
GFR calc Af Amer: 91 mL/min/{1.73_m2} (ref >59)
GFR calc non Af Amer: 79 mL/min/{1.73_m2} (ref >59)
Glucose: 148 mg/dL — ABNORMAL HIGH (ref 65–99)
Potassium: 4.8 mmol/L (ref 3.5–5.2)
Sodium: 137 mmol/L (ref 134–144)

## 2016-08-28 LAB — CBC
HEMATOCRIT: 46.3 % (ref 37.5–51.0)
Hemoglobin: 15.9 g/dL (ref 13.0–17.7)
MCH: 35.5 pg — AB (ref 26.6–33.0)
MCHC: 34.3 g/dL (ref 31.5–35.7)
MCV: 103 fL — AB (ref 79–97)
Platelets: 209 10*3/uL (ref 150–379)
RBC: 4.48 x10E6/uL (ref 4.14–5.80)
RDW: 13.5 % (ref 12.3–15.4)
WBC: 7.6 10*3/uL (ref 3.4–10.8)

## 2016-08-28 LAB — PROTIME-INR
INR: 1 (ref 0.8–1.2)
PROTHROMBIN TIME: 10.1 s (ref 9.1–12.0)

## 2016-08-28 NOTE — Telephone Encounter (Signed)
Patient contacted pre-catheterization at [facility] scheduled for:  08/30/2016 @ 1200 Verified arrival time and place:  NT @ 1000 Confirmed AM meds to be taken pre-cath with sip of water: Take ASA/Plavix prior to arrival Hold HCTZ Confirmed patient has responsible person to drive home post procedure and observe patient for 24 hours:  Yes-sister Addl concerns:  None noted-gave call back information if any further questions/concerns

## 2016-08-30 ENCOUNTER — Encounter (HOSPITAL_COMMUNITY): Payer: Self-pay | Admitting: General Practice

## 2016-08-30 ENCOUNTER — Inpatient Hospital Stay (HOSPITAL_COMMUNITY)
Admission: RE | Admit: 2016-08-30 | Discharge: 2016-09-01 | DRG: 247 | Disposition: A | Discharge status: Home / Self Care | Payer: PPO | Source: Ambulatory Visit | Attending: Cardiology | Admitting: Cardiology

## 2016-08-30 ENCOUNTER — Encounter (HOSPITAL_COMMUNITY)
Admission: RE | Disposition: A | Discharge status: Home / Self Care | Payer: Self-pay | Source: Ambulatory Visit | Attending: Cardiology

## 2016-08-30 DIAGNOSIS — Z9981 Dependence on supplemental oxygen: Secondary | ICD-10-CM

## 2016-08-30 DIAGNOSIS — I5042 Chronic combined systolic (congestive) and diastolic (congestive) heart failure: Secondary | ICD-10-CM | POA: Diagnosis present

## 2016-08-30 DIAGNOSIS — Z9181 History of falling: Secondary | ICD-10-CM

## 2016-08-30 DIAGNOSIS — Z7951 Long term (current) use of inhaled steroids: Secondary | ICD-10-CM | POA: Diagnosis not present

## 2016-08-30 DIAGNOSIS — I5043 Acute on chronic combined systolic (congestive) and diastolic (congestive) heart failure: Secondary | ICD-10-CM | POA: Diagnosis not present

## 2016-08-30 DIAGNOSIS — I2510 Atherosclerotic heart disease of native coronary artery without angina pectoris: Principal | ICD-10-CM | POA: Diagnosis present

## 2016-08-30 DIAGNOSIS — I429 Cardiomyopathy, unspecified: Secondary | ICD-10-CM | POA: Diagnosis present

## 2016-08-30 DIAGNOSIS — I2721 Secondary pulmonary arterial hypertension: Secondary | ICD-10-CM | POA: Diagnosis present

## 2016-08-30 DIAGNOSIS — F251 Schizoaffective disorder, depressive type: Secondary | ICD-10-CM | POA: Diagnosis present

## 2016-08-30 DIAGNOSIS — Z9861 Coronary angioplasty status: Secondary | ICD-10-CM | POA: Diagnosis not present

## 2016-08-30 DIAGNOSIS — J432 Centrilobular emphysema: Secondary | ICD-10-CM | POA: Diagnosis present

## 2016-08-30 DIAGNOSIS — J984 Other disorders of lung: Secondary | ICD-10-CM | POA: Diagnosis present

## 2016-08-30 DIAGNOSIS — R931 Abnormal findings on diagnostic imaging of heart and coronary circulation: Secondary | ICD-10-CM | POA: Diagnosis not present

## 2016-08-30 DIAGNOSIS — Z79899 Other long term (current) drug therapy: Secondary | ICD-10-CM

## 2016-08-30 DIAGNOSIS — Z87891 Personal history of nicotine dependence: Secondary | ICD-10-CM

## 2016-08-30 DIAGNOSIS — I25119 Atherosclerotic heart disease of native coronary artery with unspecified angina pectoris: Secondary | ICD-10-CM

## 2016-08-30 DIAGNOSIS — F250 Schizoaffective disorder, bipolar type: Secondary | ICD-10-CM | POA: Diagnosis present

## 2016-08-30 DIAGNOSIS — G4733 Obstructive sleep apnea (adult) (pediatric): Secondary | ICD-10-CM | POA: Diagnosis present

## 2016-08-30 DIAGNOSIS — I700 Atherosclerosis of aorta: Secondary | ICD-10-CM | POA: Diagnosis present

## 2016-08-30 DIAGNOSIS — E662 Morbid (severe) obesity with alveolar hypoventilation: Secondary | ICD-10-CM | POA: Diagnosis present

## 2016-08-30 DIAGNOSIS — R339 Retention of urine, unspecified: Secondary | ICD-10-CM | POA: Diagnosis present

## 2016-08-30 DIAGNOSIS — E785 Hyperlipidemia, unspecified: Secondary | ICD-10-CM | POA: Diagnosis present

## 2016-08-30 DIAGNOSIS — Z955 Presence of coronary angioplasty implant and graft: Secondary | ICD-10-CM

## 2016-08-30 DIAGNOSIS — D689 Coagulation defect, unspecified: Secondary | ICD-10-CM | POA: Diagnosis present

## 2016-08-30 DIAGNOSIS — Z01818 Encounter for other preprocedural examination: Secondary | ICD-10-CM

## 2016-08-30 DIAGNOSIS — Z7982 Long term (current) use of aspirin: Secondary | ICD-10-CM

## 2016-08-30 DIAGNOSIS — Z7902 Long term (current) use of antithrombotics/antiplatelets: Secondary | ICD-10-CM

## 2016-08-30 DIAGNOSIS — Z6841 Body Mass Index (BMI) 40.0 and over, adult: Secondary | ICD-10-CM | POA: Diagnosis not present

## 2016-08-30 HISTORY — PX: CORONARY PRESSURE/FFR STUDY: CATH118243

## 2016-08-30 HISTORY — DX: Schizophrenia, unspecified: F20.9

## 2016-08-30 HISTORY — DX: Depression, unspecified: F32.A

## 2016-08-30 HISTORY — DX: Pure hypercholesterolemia, unspecified: E78.00

## 2016-08-30 HISTORY — DX: Pneumonia, unspecified organism: J18.9

## 2016-08-30 HISTORY — DX: Secondary pulmonary arterial hypertension: I27.21

## 2016-08-30 HISTORY — PX: RIGHT/LEFT HEART CATH AND CORONARY ANGIOGRAPHY: CATH118266

## 2016-08-30 HISTORY — DX: Gastro-esophageal reflux disease without esophagitis: K21.9

## 2016-08-30 HISTORY — DX: Anxiety disorder, unspecified: F41.9

## 2016-08-30 HISTORY — DX: Ischemic cardiomyopathy: I25.5

## 2016-08-30 HISTORY — DX: Personal history of peptic ulcer disease: Z87.11

## 2016-08-30 HISTORY — DX: Unspecified systolic (congestive) heart failure: I50.20

## 2016-08-30 HISTORY — DX: Essential (primary) hypertension: I10

## 2016-08-30 HISTORY — PX: CORONARY STENT INTERVENTION: CATH118234

## 2016-08-30 HISTORY — DX: Personal history of other diseases of the digestive system: Z87.19

## 2016-08-30 HISTORY — DX: Major depressive disorder, single episode, unspecified: F32.9

## 2016-08-30 HISTORY — PX: INTRAVASCULAR PRESSURE WIRE/FFR STUDY: CATH118243

## 2016-08-30 LAB — POCT I-STAT 3, VENOUS BLOOD GAS (G3P V)
ACID-BASE EXCESS: 17 mmol/L — AB (ref 0.0–2.0)
ACID-BASE EXCESS: 4 mmol/L — AB (ref 0.0–2.0)
Acid-Base Excess: 4 mmol/L — ABNORMAL HIGH (ref 0.0–2.0)
BICARBONATE: 30.8 mmol/L — AB (ref 20.0–28.0)
BICARBONATE: 43.1 mmol/L — AB (ref 20.0–28.0)
Bicarbonate: 30.3 mmol/L — ABNORMAL HIGH (ref 20.0–28.0)
O2 SAT: 64 %
O2 SAT: 74 %
O2 Saturation: 67 %
PH VEN: 7.359 (ref 7.250–7.430)
PH VEN: 7.362 (ref 7.250–7.430)
PO2 VEN: 37 mmHg (ref 32.0–45.0)
TCO2: 32 mmol/L (ref 0–100)
TCO2: 32 mmol/L (ref 0–100)
TCO2: 45 mmol/L (ref 0–100)
pCO2, Ven: 53.7 mmHg (ref 44.0–60.0)
pCO2, Ven: 53.7 mmHg (ref 44.0–60.0)
pCO2, Ven: 54.2 mmHg (ref 44.0–60.0)
pH, Ven: 7.512 — ABNORMAL HIGH (ref 7.250–7.430)
pO2, Ven: 36 mmHg (ref 32.0–45.0)
pO2, Ven: 37 mmHg (ref 32.0–45.0)

## 2016-08-30 LAB — POCT I-STAT 3, ART BLOOD GAS (G3+)
Acid-Base Excess: 3 mmol/L — ABNORMAL HIGH (ref 0.0–2.0)
Bicarbonate: 29.4 mmol/L — ABNORMAL HIGH (ref 20.0–28.0)
O2 SAT: 92 %
TCO2: 31 mmol/L (ref 0–100)
pCO2 arterial: 49.7 mmHg — ABNORMAL HIGH (ref 32.0–48.0)
pH, Arterial: 7.38 (ref 7.350–7.450)
pO2, Arterial: 65 mmHg — ABNORMAL LOW (ref 83.0–108.0)

## 2016-08-30 LAB — POCT ACTIVATED CLOTTING TIME
ACTIVATED CLOTTING TIME: 230 s
ACTIVATED CLOTTING TIME: 307 s
Activated Clotting Time: 345 seconds
Activated Clotting Time: 313 seconds
Activated Clotting Time: 202 seconds
Activated Clotting Time: 147 seconds

## 2016-08-30 SURGERY — RIGHT/LEFT HEART CATH AND CORONARY ANGIOGRAPHY
Anesthesia: LOCAL

## 2016-08-30 MED ORDER — SODIUM CHLORIDE 0.9% FLUSH: 3.0000 mL | INTRAVENOUS | Status: DC | PRN

## 2016-08-30 MED ORDER — CLOPIDOGREL BISULFATE 300 MG PO TABS
ORAL_TABLET | ORAL | Status: AC
  Filled 2016-08-30: qty 1

## 2016-08-30 MED ORDER — CLOPIDOGREL BISULFATE 300 MG PO TABS
ORAL_TABLET | ORAL | Status: DC | PRN
Start: 2016-08-30 — End: 2016-08-30
  Administered 2016-08-30: 300 mg via ORAL

## 2016-08-30 MED ORDER — FENTANYL CITRATE (PF) 100 MCG/2ML IJ SOLN
INTRAMUSCULAR | Status: DC | PRN
  Administered 2016-08-30: 25 ug via INTRAVENOUS

## 2016-08-30 MED ORDER — HYDRALAZINE HCL 20 MG/ML IJ SOLN: 5.0000 mg | INTRAMUSCULAR | Status: AC | PRN

## 2016-08-30 MED ORDER — FLUTICASONE PROPIONATE 50 MCG/ACT NA SUSP
1.0000 | Freq: Every day | NASAL | Status: DC
  Administered 2016-09-01: 1 via NASAL
  Filled 2016-08-30: qty 16

## 2016-08-30 MED ORDER — HEPARIN SODIUM (PORCINE) 1000 UNIT/ML IJ SOLN
INTRAMUSCULAR | Status: AC
  Filled 2016-08-30: qty 1

## 2016-08-30 MED ORDER — SODIUM CHLORIDE 0.9% FLUSH
3.0000 mL | Freq: Two times a day (BID) | INTRAVENOUS | Status: DC
  Administered 2016-08-31 – 2016-09-01 (×3): 3 mL via INTRAVENOUS

## 2016-08-30 MED ORDER — IOPAMIDOL (ISOVUE-370) INJECTION 76%
INTRAVENOUS | Status: AC
  Filled 2016-08-30: qty 50

## 2016-08-30 MED ORDER — SODIUM CHLORIDE 0.9 % IV SOLN: 250.0000 mL | INTRAVENOUS | Status: DC | PRN

## 2016-08-30 MED ORDER — HEPARIN SODIUM (PORCINE) 1000 UNIT/ML IJ SOLN
INTRAMUSCULAR | Status: DC | PRN
  Administered 2016-08-30: 3000 [IU] via INTRAVENOUS
  Administered 2016-08-30: 7000 [IU] via INTRAVENOUS
  Administered 2016-08-30: 5000 [IU] via INTRAVENOUS
  Administered 2016-08-30: 8000 [IU] via INTRAVENOUS

## 2016-08-30 MED ORDER — LABETALOL HCL 5 MG/ML IV SOLN: 10.0000 mg | INTRAVENOUS | Status: AC | PRN

## 2016-08-30 MED ORDER — ALBUTEROL SULFATE (2.5 MG/3ML) 0.083% IN NEBU: 2.5000 mg | INHALATION_SOLUTION | Freq: Four times a day (QID) | RESPIRATORY_TRACT | Status: DC | PRN

## 2016-08-30 MED ORDER — ADENOSINE (DIAGNOSTIC) 140MCG/KG/MIN
INTRAVENOUS | Status: DC | PRN
  Administered 2016-08-30: 140 ug/kg/min via INTRAVENOUS

## 2016-08-30 MED ORDER — IOPAMIDOL (ISOVUE-370) INJECTION 76%
INTRAVENOUS | Status: DC | PRN
  Administered 2016-08-30: 200 mL via INTRA_ARTERIAL

## 2016-08-30 MED ORDER — LIDOCAINE HCL (PF) 1 % IJ SOLN
INTRAMUSCULAR | Status: DC | PRN
  Administered 2016-08-30: 2 mL via INTRADERMAL
  Administered 2016-08-30: 4 mL via INTRADERMAL

## 2016-08-30 MED ORDER — SODIUM CHLORIDE 0.9 % IV SOLN
INTRAVENOUS | Status: AC | PRN
  Administered 2016-08-30: 10 mL/h via INTRAVENOUS

## 2016-08-30 MED ORDER — VERAPAMIL HCL 2.5 MG/ML IV SOLN
INTRAVENOUS | Status: AC
  Filled 2016-08-30: qty 2

## 2016-08-30 MED ORDER — HYDROCHLOROTHIAZIDE 12.5 MG PO CAPS
12.5000 mg | ORAL_CAPSULE | Freq: Every day | ORAL | Status: DC
  Administered 2016-08-31: 12.5 mg via ORAL
  Filled 2016-08-30 (×2): qty 1

## 2016-08-30 MED ORDER — ANGIOPLASTY BOOK
Freq: Once | Status: AC
  Administered 2016-08-30: 22:00:00
  Filled 2016-08-30: qty 1

## 2016-08-30 MED ORDER — IOPAMIDOL (ISOVUE-370) INJECTION 76%
INTRAVENOUS | Status: AC
  Filled 2016-08-30: qty 100

## 2016-08-30 MED ORDER — MIDAZOLAM HCL 2 MG/2ML IJ SOLN
INTRAMUSCULAR | Status: AC
  Filled 2016-08-30: qty 2

## 2016-08-30 MED ORDER — FLUTICASONE-UMECLIDIN-VILANT 100-62.5-25 MCG/INH IN AEPB: 1.0000 | INHALATION_SPRAY | Freq: Every day | RESPIRATORY_TRACT | Status: DC

## 2016-08-30 MED ORDER — ALBUTEROL SULFATE HFA 108 (90 BASE) MCG/ACT IN AERS: 2.0000 | INHALATION_SPRAY | Freq: Four times a day (QID) | RESPIRATORY_TRACT | Status: DC | PRN

## 2016-08-30 MED ORDER — POLYETHYLENE GLYCOL 3350 17 G PO PACK
17.0000 g | PACK | Freq: Two times a day (BID) | ORAL | Status: DC | PRN
  Administered 2016-09-01: 17 g via ORAL
  Filled 2016-08-30: qty 1

## 2016-08-30 MED ORDER — ONDANSETRON HCL 4 MG/2ML IJ SOLN: 4.0000 mg | Freq: Four times a day (QID) | INTRAMUSCULAR | Status: DC | PRN

## 2016-08-30 MED ORDER — CLOPIDOGREL BISULFATE 75 MG PO TABS
75.0000 mg | ORAL_TABLET | Freq: Every day | ORAL | Status: DC
  Administered 2016-08-31 – 2016-09-01 (×2): 75 mg via ORAL
  Filled 2016-08-30 (×2): qty 1

## 2016-08-30 MED ORDER — ACTIVE PARTNERSHIP FOR HEALTH OF YOUR HEART BOOK
Freq: Once | Status: AC
  Administered 2016-08-30: 22:00:00
  Filled 2016-08-30: qty 1

## 2016-08-30 MED ORDER — DIVALPROEX SODIUM 250 MG PO DR TAB
500.0000 mg | DELAYED_RELEASE_TABLET | Freq: Every day | ORAL | Status: DC
  Administered 2016-08-30 – 2016-08-31 (×2): 500 mg via ORAL
  Filled 2016-08-30 (×2): qty 1
  Filled 2016-08-30: qty 2

## 2016-08-30 MED ORDER — SODIUM CHLORIDE 0.9 % IV SOLN
INTRAVENOUS | Status: DC
  Administered 2016-08-30: 11:00:00 via INTRAVENOUS

## 2016-08-30 MED ORDER — HEPARIN (PORCINE) IN NACL 2-0.9 UNIT/ML-% IJ SOLN
INTRAMUSCULAR | Status: AC
  Filled 2016-08-30: qty 1000

## 2016-08-30 MED ORDER — UMECLIDINIUM BROMIDE 62.5 MCG/INH IN AEPB
1.0000 | INHALATION_SPRAY | Freq: Every day | RESPIRATORY_TRACT | Status: DC
  Administered 2016-09-01: 09:00:00 1 via RESPIRATORY_TRACT
  Filled 2016-08-30: qty 7

## 2016-08-30 MED ORDER — ADENOSINE 12 MG/4ML IV SOLN
INTRAVENOUS | Status: AC
  Filled 2016-08-30: qty 16

## 2016-08-30 MED ORDER — HEPARIN (PORCINE) IN NACL 2-0.9 UNIT/ML-% IJ SOLN
INTRAMUSCULAR | Status: AC
  Filled 2016-08-30: qty 500

## 2016-08-30 MED ORDER — MIDAZOLAM HCL 2 MG/2ML IJ SOLN
INTRAMUSCULAR | Status: DC | PRN
  Administered 2016-08-30: 1 mg via INTRAVENOUS

## 2016-08-30 MED ORDER — SENNOSIDES-DOCUSATE SODIUM 8.6-50 MG PO TABS
2.0000 | ORAL_TABLET | Freq: Two times a day (BID) | ORAL | Status: DC | PRN
  Administered 2016-09-01: 2 via ORAL
  Filled 2016-08-30: qty 2

## 2016-08-30 MED ORDER — SODIUM CHLORIDE 0.9 % IV SOLN: INTRAVENOUS | Status: AC

## 2016-08-30 MED ORDER — HEPARIN (PORCINE) IN NACL 2-0.9 UNIT/ML-% IJ SOLN
INTRAMUSCULAR | Status: AC | PRN
  Administered 2016-08-30: 1000 mL
  Administered 2016-08-30: 500 mL

## 2016-08-30 MED ORDER — HEPARIN (PORCINE) IN NACL 2-0.9 UNIT/ML-% IJ SOLN
INTRAMUSCULAR | Status: DC | PRN
  Administered 2016-08-30: 10 mL via INTRA_ARTERIAL

## 2016-08-30 MED ORDER — METOPROLOL TARTRATE 25 MG PO TABS
25.0000 mg | ORAL_TABLET | Freq: Two times a day (BID) | ORAL | Status: DC
  Administered 2016-08-30 – 2016-09-01 (×4): 25 mg via ORAL
  Filled 2016-08-30 (×2): qty 2
  Filled 2016-08-30 (×2): qty 1

## 2016-08-30 MED ORDER — FLUTICASONE FUROATE-VILANTEROL 100-25 MCG/INH IN AEPB
1.0000 | INHALATION_SPRAY | Freq: Every day | RESPIRATORY_TRACT | Status: DC
  Administered 2016-08-31 – 2016-09-01 (×2): 1 via RESPIRATORY_TRACT
  Filled 2016-08-30: qty 28

## 2016-08-30 MED ORDER — LIDOCAINE HCL (PF) 1 % IJ SOLN
INTRAMUSCULAR | Status: AC
  Filled 2016-08-30: qty 30

## 2016-08-30 MED ORDER — ACETAMINOPHEN 325 MG PO TABS: 650.0000 mg | ORAL_TABLET | ORAL | Status: DC | PRN

## 2016-08-30 MED ORDER — FENTANYL CITRATE (PF) 100 MCG/2ML IJ SOLN
INTRAMUSCULAR | Status: AC
  Filled 2016-08-30: qty 2

## 2016-08-30 MED ORDER — ATORVASTATIN CALCIUM 10 MG PO TABS
10.0000 mg | ORAL_TABLET | Freq: Every day | ORAL | Status: DC
  Administered 2016-08-30: 10 mg via ORAL
  Filled 2016-08-30: qty 1

## 2016-08-30 MED ORDER — MONTELUKAST SODIUM 10 MG PO TABS
10.0000 mg | ORAL_TABLET | Freq: Every day | ORAL | Status: DC
  Administered 2016-08-30 – 2016-08-31 (×2): 10 mg via ORAL
  Filled 2016-08-30 (×3): qty 1

## 2016-08-30 SURGICAL SUPPLY — 31 items
BALLN SAPPHIRE 2.0X12 (BALLOONS) ×2
BALLN SAPPHIRE 2.5X15 (BALLOONS) ×2
BALLN SAPPHIRE ~~LOC~~ 3.5X12 (BALLOONS) ×2 IMPLANT
BALLN SAPPHIRE ~~LOC~~ 4.0X15 (BALLOONS) ×2 IMPLANT
BALLOON SAPPHIRE 2.0X12 (BALLOONS) ×1 IMPLANT
BALLOON SAPPHIRE 2.5X15 (BALLOONS) ×1 IMPLANT
CATH BALLN WEDGE 5F 110CM (CATHETERS) ×2 IMPLANT
CATH GUIDEZILLA II 6F (CATHETERS) ×1 IMPLANT
CATH HEARTRAIL 6F IR1.5 (CATHETERS) ×2 IMPLANT
CATH MICROCATH NAVVUS (MICROCATHETER) ×1 IMPLANT
CATH OPTITORQUE TIG 4.0 5F (CATHETERS) ×2 IMPLANT
CATH VISTA GUIDE 6FR JR4 (CATHETERS) ×2 IMPLANT
CATH VISTA GUIDE 6FR XBRCA (CATHETERS) ×2 IMPLANT
CATHETER GUIDEZILLA II 6F (CATHETERS) ×2
DEVICE RAD COMP TR BAND LRG (VASCULAR PRODUCTS) ×2 IMPLANT
GLIDESHEATH SLEND A-KIT 6F 22G (SHEATH) ×2 IMPLANT
GUIDEWIRE INQWIRE 1.5J.035X260 (WIRE) ×1 IMPLANT
INQWIRE 1.5J .035X260CM (WIRE) ×2
KIT ENCORE 26 ADVANTAGE (KITS) ×2 IMPLANT
KIT HEART LEFT (KITS) ×2 IMPLANT
MICROCATHETER NAVVUS (MICROCATHETER) ×2
PACK CARDIAC CATHETERIZATION (CUSTOM PROCEDURE TRAY) ×2 IMPLANT
SHEATH GLIDE SLENDER 4/5FR (SHEATH) ×2 IMPLANT
STENT SYNERGY DES 3.5X20 (Permanent Stent) ×2 IMPLANT
STENT SYNERGY DES 3X16 (Permanent Stent) ×2 IMPLANT
STENT SYNERGY DES 3X20 (Permanent Stent) ×2 IMPLANT
TRANSDUCER W/STOPCOCK (MISCELLANEOUS) ×2 IMPLANT
TUBING CIL FLEX 10 FLL-RA (TUBING) ×2 IMPLANT
VALVE GUARDIAN II ~~LOC~~ HEMO (MISCELLANEOUS) ×2 IMPLANT
WIRE HI TORQ BMW 190CM (WIRE) ×2 IMPLANT
WIRE MAILMAN 182CM (WIRE) ×2 IMPLANT

## 2016-08-30 NOTE — Progress Notes (Signed)
Site area: right brachial  Site Prior to Removal:  Level 0  Pressure Applied For 12 MINUTES    Minutes Beginning at 19:30  Manual:   Yes.    Patient Status During Pull:  WNL  Post Pull Groin Site:  Level 0  Post Pull Instructions Given:  Yes.    Post Pull Pulses Present:  Yes.    Dressing Applied:  Yes.    Comments:  Pt tolerated well.

## 2016-08-30 NOTE — Progress Notes (Signed)
TR BAND REMOVAL  LOCATION:    right radial  DEFLATED PER PROTOCOL:    Yes.    TIME BAND OFF / DRESSING APPLIED:    21:15   SITE UPON ARRIVAL:    Level 0  SITE AFTER BAND REMOVAL:    Level 0  CIRCULATION SENSATION AND MOVEMENT:    Within Normal Limits   Yes.    COMMENTS:   Post TR band instructions given. Pt tolerated well. 

## 2016-08-30 NOTE — Brief Op Note (Signed)
BRIEF CARDIAC CATHETERIZATION NOTE  PCP: Donella Stade, PA-C CARDIOLOGIST: Glenetta Hew, MD  08/30/2016  3:42 PM  PATIENT:  Ryan Berg  71 y.o. male with severe receptive lung disease and COPD would obesity with OSA and OHS with a history of coronary disease status post PCI (unknown at this time). He was seen for shortness of breath and coronary calcification. He underwent coronary CTA with FFR that revealed a significant lesion in the RCA. As likely pulmonary hypertension, he is now referred for right left heart catheterization.  PRE-OPERATIVE DIAGNOSIS:  abnormal CORONARY ct with FFR 0.66 in RCA  POST-OPERATIVE DIAGNOSIS:    Dominant RCA: TANDEM ~55-70% LESIONS IN PROXIMAL-MID RCA ( @ major proximal bend)   FFR 0.79  PCI with 3 overlapping DES Synergy (3.0 x 18 mm, 3.0 mm x 20 mm & 3.5 mm x 20 mm - tapered from 4.2 - 3.6)  Left main bifurcates into LAD and circumflex.  LAD trifurcates into moderate caliber distal LAD and 2 diagonal branches after 2 septal perforators. This 50% stenosis just prior to this branch point. The remainder of the LAD and diagonals are free of disease.  Large circumflex gives off a small OM1 and terminates as a major OM 2. There is a stent present in the proximal circumflex prior to OM 2. Minimal after the AV groove branch  Moderate Pulmonary Hypertension: PA pressure 58/26/42 mmHg. PCWP 32 mmHg. RVP 49/9/16 mmHg, mean RA pressure 14 mm; LVP/EDP 120/11/22 mmHg, AoP/MAP 124/7/95 mmHg  AO sat 92%, PA sat 60%. - Cardiac output by Fick: 6.66. Cardiac index 2.56   PROCEDURE:  Procedure(s): RIGHT/LEFT HEART CATH AND CORONARY ANGIOGRAPHY (N/A) CORONARY STENT INTERVENTION (N/A) INTRAVASCULAR PRESSURE WIRE/FFR STUDY (N/A)   Right brachial IV converted into 5 French glide sheath ---> 5 French Swan-Ganz catheter advanced for right heart Performance. Oxygen saturation is obtained and PA position. Pressures monitored and oriented PA PCP position.  Right  radial 6 French sheath - micropuncture kit using ultrasound guidance.  Left heart cath with coronary angiography performed using TIG 4.0 catheter  Additional boluses of IV Heparin given - for ACT > 250  Sec.  FFR of RCA - 6 [place] JR4 catheter, Prowater wire - Acist FFR Catheter  PCI to RCA - changed to 6Fr Ikari R Guide Catheter - changed for Mailman extra support guidewire, [employer] Extension catheter used.  Initial pre-dilation: 2.5 x 15 balloon  3 overlapping stents placed (with extreme difficulty): (3.0 x 18 mm, 3.0 mm x 20 mm & 3.5 mm x 20 mm - tapered from 4.2 - 3.6)  Post-dilation of distal stents with 3.5 x 10 mm [place] balloon to 18 Atm  Post-dilation of mid & proximal stent using 4.0 x 15 mm  balloon to 20 Atm  Brachial sheath removed ni PACU Holding area --> manual pressure  TR Band removed in Cath lab:1530 hrs, 12 Atm.   SURGEON:  Surgeon(s) and Role:    * Leonie Man, MD - Primary  PHYSICIAN ASSISTANT:   ANESTHESIA:   local and IV sedation - 23m Versed, 25 mcg Fentanyl given midway through the case  EBL:  <553m MEDICATIONS USED:  IV Heparin total 21,000 Units; 300 mg Plavix  COUNTS:  YES  DICTATION: .Note written in EPIC  PLAN OF CARE: Admit for overnight observation - standard TR Band removal protocol  Plan discharge the morning. Will follow-up with Dr. HaEllyn HackPATIENT DISPOSITION:  PACU - hemodynamically stable.   Delay start of  Pharmacological VTE agent (>24hrs) due to surgical blood loss or risk of bleeding: not applicable   Glenetta Hew, M.D., M.S. Interventional Cardiologist   Pager # [phone] Phone # [phone] [address]. [place], [place] [address]

## 2016-08-30 NOTE — Interval H&P Note (Signed)
History and Physical Interval Note:  08/30/2016 12:30 PM  Ryan Berg  has presented today for surgery, with the diagnosis of abnormal ct with exertional dyspnea.  Known CAD.   The various methods of treatment have been discussed with the patient and family. After consideration of risks, benefits and other options for treatment, the patient has consented to  Procedure(s): RIGHT/LEFT HEART CATH AND CORONARY ANGIOGRAPHY (N/A) With Possible Percutaneous Coronary Intervention as a surgical intervention .    The patient's history has been reviewed, patient examined, no change in status, stable for surgery.  I have reviewed the patient's chart and labs.  Questions were answered to the patient's satisfaction.    Cath Lab Visit (complete for each Cath Lab visit)  Clinical Evaluation Leading to the Procedure:   ACS: No.  Non-ACS:    Anginal Classification: CCS II  Anti-ischemic medical therapy: Minimal Therapy (1 class of medications)  Non-Invasive Test Results: High-risk stress test findings: cardiac mortality >3%/year  Prior CABG: No previous CABG   Bryan Lemmaavid Harding

## 2016-08-30 NOTE — H&P (View-Only) (Signed)
PCP: Jomarie Longs, PA-C  Clinic Note: Chief Complaint  Patient presents with  . Follow-up    follow up from testing coronary CTA-FFR  . Cardiomyopathy    HPI: Math Brazie is a 71 y.o. 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 There is somewhat confusion as his past medical history since we have not been getting records from [place]. According to his sister, his POA, she was told that he had a stent done a couple years ago. The patient himself has no recollection. He is on aspirin and Plavix. He has severe restrictive lung disease with FEV1 of 47%, FVC 50% and ratio 73%. He is on home oxygen. He intermittently uses CPAP for OSA although he tells me today he doesn't use it.  He has schizoaffective disorder, bipolar type.   Khizar Fiorella was initially seen on 07/23/2016 by Theodore Demark, PA-C. This was in follow-up of an echocardiogram that showed EF of 45-50%. (Questionable history of CAD - sister indicates h/p PCI, hypertension and hyperlipidemia). Suffered a cerebral hemorrhage after a fall in August 2017. She did not hospitalizations indicate hypertension, hyperlipidemia left bundle-branch block). - When he saw her on that he was noting exertional dyspnea simply walking to the mailbox. He has OSA, is not consistent wearing CPAP. He noted hand swelling as well as some evening leg swelling. He states on 2 pillows. He does use home oxygen most the time but doesn't use it while sitting still. - As a side note, Bjorn Loser noted that his diet is poor, eats a lot of hamburgers. When he moved here from [place] in 2017, he weighed about 260. He was put on a psych med that made him gain weight.   Recent Hospitalizations: None recently  Studies Personally Reviewed - (if available, images/films reviewed: From Epic Chart or Care Everywhere)  2-D  [facility] [place] March March 2018: Normal LV size and function. Normal RV size and  function. No poor cardiac effusion.(Care Everywhere, from [facility])   2-D Echo 07/04/2016: Mild LVH - EF 45-50% with Diffuse Hypokinesis. Unable to determine diastolic function. Mildly dilated ascending aorta.  Coronary CTA-CT FFR: Cardiac calcium score 1425. Diffuse plaque at least 50-69%. Possible greater than 70% stenosis in the proximal and mid RCA and mid LAD - FFR : LAD FFR 0.89. Proximal-mid RCA FFR 0.66.;   noncardiac findings: Mild diffuse bronchial wall thickening with mild to moderate centrilobular and mild paraseptal emphysema suggestive of underlying COPD. Also noted aortic atherosclerosis.   Interval History: Ly presents here today for follow-up after CT FFR finding to discuss the results. He is an obese gentleman with severe lung disease on oxygen who is extremely dyspneic simply walking back to the exam room. He denies any chest tightness or pressure with ambulation or activity. He does not use CPAP, but does use oxygen at nighttime. He has some mild hand and ankle swelling, no significant edema. No real PND or orthopnea as long as he has oxygen on. His major symptom he notes is the dyspnea, he denies any symptoms of palpitations or irregular heartbeats syncope or near syncope. No TIA or amaurosis fugax symptoms. As long as he is sitting still, he has no breathing issues, but as his torso to any exertion he gets profoundly dyspneic.  He doesn't recall ever having any chest discomfort at the time of his prior stent placement either. His symptoms always been dyspnea. He does again  acknowledged that he enjoys his hamburgers and other fast food items. No melena, hematochezia, hematuria, or epstaxis. No claudication.  ROS: A comprehensive was performed. Review of Systems  Constitutional: Positive for malaise/fatigue (Mostly because he is short of breath doing things). Negative for chills, fever and weight loss (He has actually gained weight).  HENT: Negative for congestion and  nosebleeds.   Eyes: Negative for blurred vision.  Respiratory: Positive for cough (Only productive in the morning), shortness of breath and wheezing.   Cardiovascular:       Per history of present illness  Gastrointestinal: Negative for abdominal pain, blood in stool, heartburn and melena.  Genitourinary: Negative for hematuria.  Musculoskeletal: Positive for back pain and joint pain. Negative for falls.  Psychiatric/Behavioral: Positive for memory loss (Not sure if his memory loss or just poor recollection in general). Negative for hallucinations. The patient is nervous/anxious and has insomnia.        He seems relatively stable at present on his psychotropic agents.  All other systems reviewed and are negative.    I have reviewed and (if needed) personally updated the patient's problem list, medications, allergies, past medical and surgical history, social and family history.   Past Medical History:  Diagnosis Date  . Bipolar affective (HCC)   . Centrilobular emphysema (HCC)     Mild diffuse bronchial wall thickening with mild to moderate centrilobular and mild paraseptal emphysema suggestive of underlying COPD.  Marland Kitchen. 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 COPD (chronic obstructive pulmonary disease) (HCC), home O2 at 2 lpm   . OSA on CPAP   . Persistent asthma   . Thoracic aortic atherosclerosis (HCC)    noted on CT    Past Surgical History:  Procedure Laterality Date  . APPENDECTOMY    . HERNIA REPAIR    . TRANSTHORACIC ECHOCARDIOGRAM  06/2016    Mild LVH - EF 45-50% with Diffuse Hypokinesis. Unable to determine diastolic function. Mildly dilated ascending aorta.    Current Meds  Medication Sig  . albuterol (PROVENTIL HFA;VENTOLIN HFA) 108 (90 Base) MCG/ACT inhaler Inhale 2 puffs into the lungs every 6 (six) hours as needed for wheezing or shortness of breath.  Marland Kitchen. albuterol (PROVENTIL) (2.5 MG/3ML) 0.083% nebulizer solution Take 3 mLs (2.5  mg total) by nebulization every 6 (six) hours as needed for wheezing or shortness of breath.  . AMBULATORY NON FORMULARY MEDICATION CPAP and supplies set on auto range.  . AMBULATORY NON FORMULARY MEDICATION Due to chronic asthma and hypoxemia pulse ox at rest on room air was 83 percent and needs 2L continuous O2 and portable.  . AMBULATORY NON FORMULARY MEDICATION Medication Name: Nebulizer with tubing and supplies for Asthma.  I belive he uses [employer] for his oxygen.  Albuterol 2.5mg /543ml neb solution.  Given Q 6 hours PRN SOB or wheezing.  #100 with 3 RF.  Marland Kitchen. atorvastatin (LIPITOR) 10 MG tablet Take 1 tablet (10 mg total) by mouth daily at 6 PM.  . clopidogrel (PLAVIX) 75 MG tablet TAKE 1 TABLET BY MOUTH EVERY DAY  . divalproex (DEPAKOTE) 500 MG DR tablet Take 500 mg by mouth daily.   Marland Kitchen. DOK 100 MG capsule TAKE 1 CAPSULE BY MOUTH 2 TIMES DAILY (Patient taking differently: TAKE 1 CAPSULE BY MOUTH 2 TIMES DAILY AS NEEDED FOR CONSTIPATION.)  . fluticasone (FLONASE) 50 MCG/ACT nasal spray Place 1 spray into the nose daily as needed (for allergies.).   Marland Kitchen. Fluticasone-Umeclidin-Vilant (TRELEGY ELLIPTA) 100-62.5-25 MCG/INH  AEPB Inhale 1 puff into the lungs daily.  . haloperidol (HALDOL) 10 MG tablet Take 10 mg by mouth 2 (two) times daily.   . haloperidol decanoate (HALDOL DECANOATE) 50 MG/ML injection Inject 50 mg into the muscle every 28 (twenty-eight) days.   . hydrochlorothiazide (MICROZIDE) 12.5 MG capsule TAKE 1 CAPSULE BY MOUTH DAILY AS NEEDED FOR SWELLING (Patient taking differently: TAKE 1 CAPSULE BY MOUTH DAILY)  . metoprolol tartrate (LOPRESSOR) 25 MG tablet Take 1 tablet (25 mg total) by mouth 2 (two) times daily. (Patient taking differently: Take 12.5 mg by mouth 2 (two) times daily. )  . mirtazapine (REMERON) 30 MG tablet Take 30 mg by mouth at bedtime.   . montelukast (SINGULAIR) 10 MG tablet Take 1 tablet (10 mg total) by mouth at bedtime.  . polyethylene glycol (MIRALAX / GLYCOLAX) packet  Take 17 g by mouth 2 (two) times daily. Until stooling regularly (Patient taking differently: Take 17 g by mouth 2 (two) times daily as needed (for constipation.). Until stooling regularly)  . predniSONE (DELTASONE) 20 MG tablet Take 3 tablets for 3 days, take 2 tablets for 3 days, take 1 tablet for 3 days, take 1/2 tablet for 4 days. (Patient not taking: Reported on 08/27/2016)  . senna-docusate (SENOKOT-S) 8.6-50 MG tablet Take 2 tablets by mouth 2 (two) times daily. Until stooling regularly (Patient taking differently: Take 2 tablets by mouth 2 (two) times daily as needed (for constipation.). Until stooling regularly)    No Known Allergies  Social History   Social History  . Marital status: Divorced    Spouse name: N/A  . Number of children: N/A  . Years of education: N/A   Occupational History  . Disabled    Social History Main Topics  . Smoking status: Former Smoker    Types: Cigarettes    Quit date: 01/22/1990  . Smokeless tobacco: Never Used  . Alcohol use Yes     Comment: rarely  . Drug use: Unknown  . Sexual activity: Not Asked   Other Topics Concern  . None   Social History Narrative   Pt lives with sister in an apartment. He moved here from [place] in June of 2017.    family history includes Heart disease (age of onset: 29) in his mother; Heart disease (age of onset: 79) in his father; Hypertension in his father and mother.  Wt Readings from Last 3 Encounters:  08/27/16 (!) 322 lb (146.1 kg)  07/23/16 (!) 309 lb (140.2 kg)  06/08/16 (!) 306 lb (138.8 kg)    PHYSICAL EXAM BP 116/60   Pulse 66   Ht 5\' 11"  (1.803 m)   Wt (!) 322 lb (146.1 kg)   BMI 44.91 kg/m   Short of breath simply walking into the clinic. Wears home oxygen Physical Exam  Constitutional: He appears well-developed and well-nourished. No distress (Not really distress, just short of breath).  Somewhat disheveled morbidly obese gentleman. Wearing home oxygen. Extremely dyspneic walking to the  exam room  HENT:  Head: Normocephalic and atraumatic.  Mouth/Throat: No oropharyngeal exudate.  Eyes: Pupils are equal, round, and reactive to light. Conjunctivae and EOM are normal.  Neck: Normal range of motion. Carotid bruit is not present.  Unable to assess JVD or HJR due to body habitus.  Cardiovascular: Normal rate and regular rhythm.   Occasional extrasystoles are present. PMI is not displaced (Unable to palpate).  Exam reveals distant heart sounds and decreased pulses. Exam reveals no gallop.   No murmur  heard. Pulmonary/Chest: Accessory muscle usage present. No respiratory distress. He has decreased breath sounds (With diffuse interstitial sounds and mild expiratory wheezing).  Abdominal: Soft. Bowel sounds are normal. He exhibits no distension. There is no hepatosplenomegaly (Unable to palpate because of obesity). There is no tenderness. There is no rebound and no CVA tenderness.  Obese. Unable to palpate HSM  Skin: He is diaphoretic.  Nursing note and vitals reviewed.    Adult ECG Report Not checked  Other studies Reviewed: Additional studies/ records that were reviewed today include:  Recent Labs:   Lab Results  Component Value Date   CREATININE 0.91 08/15/2016   BUN 15 08/15/2016   NA 143 08/15/2016   K 4.9 08/15/2016   CL 103 08/15/2016   CO2 25 08/15/2016   Last hemoglobin A1c recorded In Care Everywhere 08/25/2015: 5.7. Total Cholesterol 167, Triglycerides 210, HDL 38, LDL 87.    ASSESSMENT / PLAN: Problem List Items Addressed This Visit    Abnormal cardiac CT angiography    CT FFR negative LAD lesion but significant positive RCA lesion. Plan is to proceed with coronary angiography with right left heart catheterization plus minus PCI.      Relevant Orders   LEFT AND RIGHT HEART CATHETERIZATION WITH CORONARY ANGIOGRAM   CBC   Basic metabolic panel   Protime-INR   Centrilobular emphysema (HCC) (Chronic)    Probably the biggest reason for his dyspnea,  however with FFR positive lesion in the RCA, we are necessarily obligated to evaluate with cardiac catheterization. In order to determine the pulmonary pressures and RV pressures, we will opt for right and left heart catheterization as opposed to simply left heart cath.      Chronic combined systolic and diastolic heart failure (HCC) (Chronic)    On the concern with a reduced EF these gained quite a bit of weight. It does not appear to be fluid weight, however we cannot be sure. He is profoundly dyspneic and difficult to get a story from him. He is not on a diuretic.  Since he does have significant obstructive lung disease and coronary disease, we need to confirm or deny the presence of true volume overload. Plan: We will do right heart cath along with left heart catheterization for volume assessment etc.      Relevant Orders   LEFT AND RIGHT HEART CATHETERIZATION WITH CORONARY ANGIOGRAM   Coronary artery disease involving native coronary artery of native heart without angina pectoris - Primary (Chronic)    Reportedly, history of PCI, but unknown territory. He does have 2 vessel coronary disease by coronary CTA with significant lesion noted in the RCA and moderate lesion in the LAD. CT FFR was positive with FFR of 0.66 in the RCA indicating physiologic significant disease in the RCA. Based on reduced EF on echocardiogram and progressive dyspnea, we discussed the best course of action going forward.  I think given his risk factors of hyperlipidemia, obesity, and hypertension as well as a known history of CAD, the best course of action we proceed with right and left heart catheterization. This will allow [place] to anatomically evaluate his coronary arteries but also measure left ventricular filling pressures as well as right heart cath pressures      Relevant Orders   LEFT AND RIGHT HEART CATHETERIZATION WITH CORONARY ANGIOGRAM   Dyspnea (Chronic)    Multifactorial. Probably related to COPD and  restrictive lung disease, but cannot exclude an ischemic component..  Plan: Continue home oxygen. Right and left heart  cath to assess pulmonary pressures as well as coronary anatomy.      Relevant Orders   LEFT AND RIGHT HEART CATHETERIZATION WITH CORONARY ANGIOGRAM   CBC   Basic metabolic panel   Protime-INR   Hyperlipidemia with target low density lipoprotein (LDL) cholesterol less than 70 mg/dL (Chronic)    Last lipids were LAST year. He is due now. I'll increase his atorvastatin to 20 mg at this point, and then if not checked by his PCP in the next month or 2, we can check here.      Restrictive lung disease (Chronic)    Followed by pulmonary medicine along with COPD. Defer management to them. Plan: Add right heart cath to Left heart catheterization        Other Visit Diagnoses    Pre-op testing       Relevant Orders   CBC   Basic metabolic panel   Protime-INR   Clotting disorder [facility])       Relevant Orders   Protime-INR     Performing MD:  Bryan Lemma, M.D., M.S.  Procedure:  Right and Left Heart Catheterization with Coronary Angiography and Possible Percutaneous Coronary Intervention  The procedure with Risks/Benefits/Alternatives and Indications was reviewed with the patient and sister.  All questions were answered.    Risks / Complications include, but not limited to: Death, MI, CVA/TIA, VF/VT (with defibrillation), Bradycardia (need for temporary pacer placement), contrast induced nephropathy, bleeding / bruising / hematoma / pseudoaneurysm, vascular or coronary injury (with possible emergent CT or Vascular Surgery), adverse medication reactions, infection.  Additional risks involving the use of radiation with the possibility of radiation burns and cancer were explained in detail.  The patient and sister voice understanding and agree to proceed.      Current medicines are reviewed at length with the patient today. (+/- concerns) n/a The following changes have  been made: See below   Patient Instructions    [facility] CARDIOVASCULAR DIVISION [facility] [address] [place] [address] Dept: [phone] Loc: [phone]  Keon Pender  08/27/2016  You are scheduled for a RIGHT AND LEFT Cardiac Catheterization on Thursday, August 9 with Dr. Bryan Lemma.  1. Please arrive at the [facility] (Main Entrance A) at [facility]: [address] [place], [place] [address] at 10:00 AM (two hours before your procedure to ensure your preparation). Free valet parking service is available.   Special note: Every effort is made to have your procedure done on time. Please understand that emergencies sometimes delay scheduled procedures.  2. Diet: Do not eat or drink anything after midnight prior to your procedure except sips of water to take medications.  3. Labs: TODAY -- [facility] -- BMP,CBC PT/INR  4. Medication instructions in preparation for your procedure:      On the morning of your procedure, take your Aspirin  81 MG, and any morning medicines NOT listed above.  You may use sips of water.  5. Plan for one night stay--bring personal belongings. 6. Bring a current list of your medications and current insurance cards. 7. You MUST have a responsible person to drive you home. 8. Someone MUST be with you the first 24 hours after you arrive home or your discharge will be delayed. 9. Please wear clothes that are easy to get on and off and wear slip-on shoes.  Thank you for allowing [place] to care for you!   -- [facility]  [facility]   Your physician recommends that you schedule a follow-up appointment in 2 WEEKS WITH DR HARDING.    Studies Ordered:   Orders Placed This Encounter  Procedures  . CBC  . Basic metabolic panel  . Protime-INR  . LEFT AND RIGHT HEART CATHETERIZATION WITH CORONARY Jocelyn Lamer, M.D., M.S. Interventional  Cardiologist   Pager # [phone] Phone # [phone] [address] [place], [place] [address]

## 2016-08-30 NOTE — Progress Notes (Signed)
Placed patient on BIPAP for the night with IPAP set at 12cm and EPAP set at 6cm. Oxygen set at 2lpm

## 2016-08-31 ENCOUNTER — Encounter (HOSPITAL_COMMUNITY): Payer: Self-pay | Admitting: Cardiology

## 2016-08-31 DIAGNOSIS — Z7902 Long term (current) use of antithrombotics/antiplatelets: Secondary | ICD-10-CM | POA: Diagnosis not present

## 2016-08-31 DIAGNOSIS — J984 Other disorders of lung: Secondary | ICD-10-CM | POA: Diagnosis present

## 2016-08-31 DIAGNOSIS — I5043 Acute on chronic combined systolic (congestive) and diastolic (congestive) heart failure: Secondary | ICD-10-CM | POA: Diagnosis not present

## 2016-08-31 DIAGNOSIS — Z9181 History of falling: Secondary | ICD-10-CM | POA: Diagnosis not present

## 2016-08-31 DIAGNOSIS — Z7982 Long term (current) use of aspirin: Secondary | ICD-10-CM | POA: Diagnosis not present

## 2016-08-31 DIAGNOSIS — Z87891 Personal history of nicotine dependence: Secondary | ICD-10-CM | POA: Diagnosis not present

## 2016-08-31 DIAGNOSIS — Z79899 Other long term (current) drug therapy: Secondary | ICD-10-CM | POA: Diagnosis not present

## 2016-08-31 DIAGNOSIS — I2510 Atherosclerotic heart disease of native coronary artery without angina pectoris: Secondary | ICD-10-CM | POA: Diagnosis present

## 2016-08-31 DIAGNOSIS — Z7951 Long term (current) use of inhaled steroids: Secondary | ICD-10-CM | POA: Diagnosis not present

## 2016-08-31 DIAGNOSIS — Z6841 Body Mass Index (BMI) 40.0 and over, adult: Secondary | ICD-10-CM | POA: Diagnosis not present

## 2016-08-31 DIAGNOSIS — F251 Schizoaffective disorder, depressive type: Secondary | ICD-10-CM | POA: Diagnosis present

## 2016-08-31 DIAGNOSIS — J432 Centrilobular emphysema: Secondary | ICD-10-CM | POA: Diagnosis present

## 2016-08-31 DIAGNOSIS — I700 Atherosclerosis of aorta: Secondary | ICD-10-CM | POA: Diagnosis present

## 2016-08-31 DIAGNOSIS — F250 Schizoaffective disorder, bipolar type: Secondary | ICD-10-CM | POA: Diagnosis present

## 2016-08-31 DIAGNOSIS — R339 Retention of urine, unspecified: Secondary | ICD-10-CM | POA: Diagnosis present

## 2016-08-31 DIAGNOSIS — R931 Abnormal findings on diagnostic imaging of heart and coronary circulation: Secondary | ICD-10-CM | POA: Diagnosis not present

## 2016-08-31 DIAGNOSIS — Z9981 Dependence on supplemental oxygen: Secondary | ICD-10-CM | POA: Diagnosis not present

## 2016-08-31 DIAGNOSIS — E662 Morbid (severe) obesity with alveolar hypoventilation: Secondary | ICD-10-CM | POA: Diagnosis present

## 2016-08-31 DIAGNOSIS — E785 Hyperlipidemia, unspecified: Secondary | ICD-10-CM | POA: Diagnosis present

## 2016-08-31 DIAGNOSIS — I5042 Chronic combined systolic (congestive) and diastolic (congestive) heart failure: Secondary | ICD-10-CM | POA: Diagnosis present

## 2016-08-31 DIAGNOSIS — I429 Cardiomyopathy, unspecified: Secondary | ICD-10-CM | POA: Diagnosis present

## 2016-08-31 DIAGNOSIS — D689 Coagulation defect, unspecified: Secondary | ICD-10-CM | POA: Diagnosis present

## 2016-08-31 DIAGNOSIS — I2721 Secondary pulmonary arterial hypertension: Secondary | ICD-10-CM | POA: Diagnosis present

## 2016-08-31 DIAGNOSIS — Z9861 Coronary angioplasty status: Secondary | ICD-10-CM | POA: Diagnosis not present

## 2016-08-31 LAB — BASIC METABOLIC PANEL
ANION GAP: 7 (ref 5–15)
BUN: 16 mg/dL (ref 6–20)
CHLORIDE: 104 mmol/L (ref 101–111)
CO2: 29 mmol/L (ref 22–32)
CREATININE: 0.87 mg/dL (ref 0.61–1.24)
Calcium: 8.8 mg/dL — ABNORMAL LOW (ref 8.9–10.3)
GFR calc non Af Amer: >60 mL/min (ref >60)
Glucose, Bld: 97 mg/dL (ref 65–99)
POTASSIUM: 4.2 mmol/L (ref 3.5–5.1)
SODIUM: 140 mmol/L (ref 135–145)

## 2016-08-31 LAB — CBC
HEMATOCRIT: 42.5 % (ref 39.0–52.0)
HEMOGLOBIN: 14.3 g/dL (ref 13.0–17.0)
MCH: 34.1 pg — ABNORMAL HIGH (ref 26.0–34.0)
MCHC: 33.6 g/dL (ref 30.0–36.0)
MCV: 101.4 fL — AB (ref 78.0–100.0)
PLATELETS: 197 10*3/uL (ref 150–400)
RBC: 4.19 MIL/uL — AB (ref 4.22–5.81)
RDW: 13.2 % (ref 11.5–15.5)
WBC: 7.1 10*3/uL (ref 4.0–10.5)

## 2016-08-31 LAB — GLUCOSE, CAPILLARY: Glucose-Capillary: 114 mg/dL — ABNORMAL HIGH (ref 65–99)

## 2016-08-31 MED ORDER — LISINOPRIL 2.5 MG PO TABS
2.5000 mg | ORAL_TABLET | Freq: Every day | ORAL | Status: DC
  Administered 2016-08-31 – 2016-09-01 (×2): 2.5 mg via ORAL
  Filled 2016-08-31 (×2): qty 1

## 2016-08-31 MED ORDER — ATORVASTATIN CALCIUM 80 MG PO TABS
80.0000 mg | ORAL_TABLET | Freq: Every day | ORAL | Status: DC
  Administered 2016-08-31: 80 mg via ORAL
  Filled 2016-08-31: qty 1

## 2016-08-31 MED ORDER — FUROSEMIDE 10 MG/ML IJ SOLN
40.0000 mg | Freq: Two times a day (BID) | INTRAMUSCULAR | Status: DC
  Administered 2016-08-31 (×2): 40 mg via INTRAVENOUS
  Filled 2016-08-31 (×2): qty 4

## 2016-08-31 MED ORDER — LIDOCAINE HCL 2 % EX GEL
1.0000 | Freq: Once | CUTANEOUS | Status: AC
Start: 2016-08-31 — End: 2016-08-31
  Administered 2016-08-31: 1 via URETHRAL
  Filled 2016-08-31: qty 5

## 2016-08-31 NOTE — Progress Notes (Signed)
Patient placed on BiPAP 12/6 with 2L oxygen bleed in.  Patient tolerating well.  RT will continue to monitor.

## 2016-08-31 NOTE — Care Management Obs Status (Signed)
MEDICARE OBSERVATION STATUS NOTIFICATION   Patient Details  Name: Ryan Berg MRN: [id number] Date of Birth: 09/19/1945   Medicare Observation Status Notification Given:  Yes    Leone Havenaylor, Cortnee Steinmiller [place], RN 08/31/2016, 9:37 AM

## 2016-08-31 NOTE — Progress Notes (Signed)
CARDIAC REHAB PHASE I   PRE:  Rate/Rhythm: 70 ? First deg    BP: sitting 134/83    SaO2: 94 2L  MODE:  Ambulation: 180 ft   POST:  Rate/Rhythm: 87 ? Afib/first degree    BP: sitting 146/88     SaO2: 86 2L, 94 2L  Pt had "cramp" in his left hip/abdomen upon trying to get up. He could not sit, he had to stand due to cramp pain. Steady walking but significant SOB. HR looks irregular, ? Afib. Controlled rate except when he was trying to get up. [facility] fatig[facility] then recliner. On chair alarm. Not appropriate for education. Will wait for his sister. Notified RN of questionable rhythm. [id number]   Harriet MassonRandi Kristan Maciah Schweigert CES, ACSM 08/31/2016 2:07 PM

## 2016-08-31 NOTE — Progress Notes (Signed)
Progress Note  Patient Name: Ryan Berg Date of Encounter: 08/31/2016  Primary Cardiologist: Dr. Herbie Baltimore   Subjective   Pt confused at baseline. He denies any pain. Comfortable on supplemental O2. No dyspnea.   Inpatient Medications    Scheduled Meds: . atorvastatin  10 mg Oral q1800  . clopidogrel  75 mg Oral Daily  . divalproex  500 mg Oral Daily  . fluticasone  1 spray Each Nare Daily  . fluticasone furoate-vilanterol  1 puff Inhalation Daily  . hydrochlorothiazide  12.5 mg Oral Daily  . metoprolol tartrate  25 mg Oral BID  . montelukast  10 mg Oral QHS  . sodium chloride flush  3 mL Intravenous Q12H  . umeclidinium bromide  1 puff Inhalation Daily   Continuous Infusions: . sodium chloride     PRN Meds: sodium chloride, acetaminophen, albuterol, ondansetron (ZOFRAN) IV, polyethylene glycol, senna-docusate, sodium chloride flush   Vital Signs    Vitals:   08/30/16 2000 08/30/16 2100 08/30/16 2316 08/31/16 0107  BP: (!) 150/82 117/80  115/61  Pulse: 77 64 66 64  Resp: (!) 21 17 19 17   Temp:    98.6 F (37 C)  TempSrc:    Axillary  SpO2: 93% 93% 97% 100%  Weight:    (!) 329 lb 2.4 oz (149.3 kg)  Height:        Intake/Output Summary (Last 24 hours) at 08/31/16 0705 Last data filed at 08/31/16 0240  Gross per 24 hour  Intake           958.75 ml  Output             1200 ml  Net          -241.25 ml   Filed Weights   08/30/16 1005 08/31/16 0107  Weight: (!) 322 lb (146.1 kg) (!) 329 lb 2.4 oz (149.3 kg)    Telemetry    NSR - Personally Reviewed  ECG    NSR - Personally Reviewed  Physical Exam   GEN: No acute distress. Obese  Neck: No JVD Cardiac: RRR, no murmurs, rubs, or gallops.  Respiratory: Clear to auscultation bilaterally. GI: Soft, nontender, non-distended  MS: No edema; No deformity. Neuro:  Nonfocal  Psych: confusion (pt and RN report this is his baseline)   Labs    Chemistry Recent Labs Lab 08/27/16 1505 08/31/16 0333  NA  137 140  K 4.8 4.2  CL 96 104  CO2 21 29  GLUCOSE 148* 97  BUN 19 16  CREATININE 0.97 0.87  CALCIUM 9.7 8.8*  GFRNONAA 79 >60  GFRAA 91 >60  ANIONGAP  --  7     Hematology Recent Labs Lab 08/27/16 1505 08/31/16 0333  WBC 7.6 7.1  RBC 4.48 4.19*  HGB 15.9 14.3  HCT 46.3 42.5  MCV 103* 101.4*  MCH 35.5* 34.1*  MCHC 34.3 33.6  RDW 13.5 13.2  PLT 209 197    Cardiac EnzymesNo results for input(s): TROPONINI in the last 168 hours. No results for input(s): TROPIPOC in the last 168 hours.   BNPNo results for input(s): BNP, PROBNP in the last 168 hours.   DDimer No results for input(s): DDIMER in the last 168 hours.   Radiology    No results found.  Cardiac Studies   Procedures   CORONARY STENT INTERVENTION  INTRAVASCULAR PRESSURE WIRE/FFR STUDY  RIGHT/LEFT HEART CATH AND CORONARY ANGIOGRAPHY  Conclusion     Hemodynamic findings consistent with moderate pulmonary hypertension - mixed picture  of primary pulmonary and secondary LV filling pressure related.  There is mild left ventricular systolic dysfunction.  LV end diastolic pressure is moderately elevated. The left ventricular ejection fraction is 45-50% by visual estimate. Relatively normal cardiac output with mildly reduced index.  There is trivial (1+) mitral regurgitation.  Prox LAD to Mid LAD lesion, 50 %stenosed.  Prox Cx stent, 0 %stenosed.  __________________________________  3 Tandem lesions in the proximal to mid RCA (FFR 0.79): Prox RCA-1 lesion, 60 %stenosed. Prox RCA-2 lesion, 65 %stenosed. Mid RCA lesion, 65 %stenosed.  A STENT SYNERGY DES 3X16 drug eluting stent was successfully placed. - postdilated to 3.6 mm  A STENT SYNERGY DES 3X20 drug-eluting stent was successfully placed. - postdilated to 4.1 mm  A STENT SYNERGY DES 3.5X20 drug-eluting stent was successfully placed - postdilated to 4.2 mm  Post intervention, there is a 0% residual stenosis.  Mid RCA lesion, 65 %stenosed.  Post  intervention, there is a 0% residual stenosis.  A STENT SYNERGY DES 3X16 drug eluting stent was successfully placed.  A STENT SYNERGY DES 3X20 drug-eluting stent was successfully placed.  A STENT SYNERGY DES 3.5X20 drug-eluting stent was successfully placed.   Angiographically confirmed data from CT angiogram with proximal to mid RCA tandem lesions with physiologically significant FFR. Successful very complex PCI with 3 DES stents placed tapered from 4.2 mm down to 3.6 mm distally. Excellent angiographic result.    Left Heart   Left Ventricle The left ventricular size is in the upper limits of normal. There is mild left ventricular systolic dysfunction. LV end diastolic pressure is moderately elevated. The left ventricular ejection fraction is 45-50% by visual estimate. There are LV function abnormalities due to global hypokinesis. There is trivial (1+) mitral regurgitation.    Aortic Valve There is no aortic valve stenosis. There is normal aortic valve motion.       Patient Profile   Ryan Berg 71 y.o. male with severe restrictive lung disease and COPD, on chronic home O2, obesity with OSA and OHS, history of coronary disease status post PCI while living in [place]. He was seen in our office recently as a new patient for shortness of breath and coronary calcification. He underwent coronary CTA with FFR that revealed a significant lesion in the RCA. Also ? pulmonary hypertension. He was referred and admitted 08/30/16 for right /left heart catheterization.  Assessment & Plan    1. CAD: Angiographically confirmed data from CT angiogram with proximal to mid RCA tandem lesions with physiologically significant FFR. S/p sSuccessful very complex PCI with 3 DES stents placed tapered from 4.2 mm down to 3.6 mm distally. Excellent angiographic result. Residual nonobstructive 50% prox-mid LAD to be treated medically. EF 45-50% by visual estimate. Stable post cath. No chest pain. Radial cath site is  stable. Continue DAPT with ASA + Plavix. Currently on Lipitor 10. Baseline LDL unknown. Given residual 50% prox-mid LAD disease, would recommend goal LDL <70 mg/dL. He is not fasting. He will need FLP in our office and further titration of statin if not at goal. Continue BB therapy with metoprolol. Consider addition of ACE/ARB if BP allows.   2. Mild Systolic HF: EF mildly reduced at 45-50%. Continue BB therapy. Consider addition of ACE/ARB at outpatient f/u if BP allows.   3. Pulmonary HTN: s/p RHC 08/30/16. Hemodynamic findings consistent with moderate pulmonary hypertension - mixed picture of primary pulmonary and secondary LV filling pressure related. LV end diastolic pressure is moderately elevated. The left ventricular ejection  fraction is 45-50% by visual estimate. Relatively normal cardiac output with mildly reduced index. He has been on HCTZ as an outpatient. Renal function and K both stable. ? Change to Lasix.   4. Restrictive Lung Disease/COPD: on chronic home 02.   5. Urinary Retention: per RN report. He required placement of foley yesterday. It has been reported that patient has h/o this. Monitor. He may need urology consult.    Signed, Robbie LisBrittainy Simmons, PA-C  08/31/2016, 7:05 AM   Patient seen and examined and history reviewed. Agree with above findings and plan. Patient tells me he doesn't feel well. SOB. Unable to void last night and foley placed. Patient states he has oxygen at home but doesn't use it. Now on 4 L [place]. Denies any chest pain.  On exam he is lethargic and plethoric. Unable to assess JVD due to large neck.  Lungs with bilateral rales R>L CV RRR without gallop or murmur. Right radial site without hematoma. 1+ edema  Patient appears to have more difficulty with volume overload and CHF this am. Cardiac cath data demonstrates significant elevation LV filling pressures with wedge of 33 mm Hg. He also has urinary retention and has foley in place. I do not think he is a  candidate for DC today. Will start IV lasix for diuresis. Leave foley in place while receiving IV lasix. Monitor weight, I/O. Daily BMET. Continue oxygen support. Lisinopril started at low dose. Needs to be on high dose statin with advanced CAD and multiple stents.   Peter SwazilandJordan, MDFACC 08/31/2016 9:11 AM

## 2016-08-31 NOTE — Care Management Note (Addendum)
Case Management Note  Patient Details  Name: Ryan Berg MRN: [id number] Date of Birth: 12/03/1945  Subjective/Objective:   From home with sister , Okey RegalCarol [phone], he [facility], he has a PCP , s/p coronary stent intervention, will cont with plavix and asa. He has fluid on board will not dc today, need to diuresis with iv lasix.                Action/Plan: NCM will follow for dc needs.  Expected Discharge Date:                  Expected Discharge Plan:  Home/Self Care  In-House Referral:     Discharge planning Services  CM Consult  Post Acute Care Choice:    Choice offered to:     DME Arranged:    DME Agency:     HH Arranged:    HH Agency:     Status of Service:  Completed, signed off  If discussed at [employer] Length of Stay Meetings, dates discussed:    Additional Comments:  Leone Havenaylor, Dusty Raczkowski [place], RN 08/31/2016, 9:01 AM

## 2016-08-31 NOTE — Progress Notes (Signed)
Report given to Verl DickerLondra Frink RN on [address], called patients sister, Okey RegalCarol, and notified her of transfer to 3 east room 3.

## 2016-08-31 NOTE — Progress Notes (Signed)
Pt observed with no urine output the first 3 hrs from the beginning of the shift. Bladder scan taken with >600 ml urine volume.Pt tried  to  void multiple times with failure. Dr Allena Katzhakravartti informed. In & out cath done as ordered with no success. [facility] as ordered. Initial output after Coude cath insertion is 1200 ml.

## 2016-09-01 ENCOUNTER — Encounter (HOSPITAL_COMMUNITY): Payer: Self-pay | Admitting: Nurse Practitioner

## 2016-09-01 DIAGNOSIS — I5042 Chronic combined systolic (congestive) and diastolic (congestive) heart failure: Secondary | ICD-10-CM

## 2016-09-01 DIAGNOSIS — I2721 Secondary pulmonary arterial hypertension: Secondary | ICD-10-CM

## 2016-09-01 DIAGNOSIS — Z9861 Coronary angioplasty status: Secondary | ICD-10-CM

## 2016-09-01 DIAGNOSIS — I2510 Atherosclerotic heart disease of native coronary artery without angina pectoris: Principal | ICD-10-CM

## 2016-09-01 DIAGNOSIS — E785 Hyperlipidemia, unspecified: Secondary | ICD-10-CM

## 2016-09-01 DIAGNOSIS — R931 Abnormal findings on diagnostic imaging of heart and coronary circulation: Secondary | ICD-10-CM

## 2016-09-01 LAB — BASIC METABOLIC PANEL
Anion gap: 9 (ref 5–15)
BUN: 17 mg/dL (ref 6–20)
CALCIUM: 9.3 mg/dL (ref 8.9–10.3)
CO2: 32 mmol/L (ref 22–32)
CREATININE: 0.88 mg/dL (ref 0.61–1.24)
Chloride: 97 mmol/L — ABNORMAL LOW (ref 101–111)
GFR calc Af Amer: >60 mL/min (ref >60)
GLUCOSE: 108 mg/dL — AB (ref 65–99)
POTASSIUM: 3.7 mmol/L (ref 3.5–5.1)
SODIUM: 138 mmol/L (ref 135–145)

## 2016-09-01 LAB — GLUCOSE, CAPILLARY: GLUCOSE-CAPILLARY: 113 mg/dL — AB (ref 65–99)

## 2016-09-01 MED ORDER — LISINOPRIL 2.5 MG PO TABS: 2.5000 mg | ORAL_TABLET | Freq: Every day | ORAL | 6 refills | Status: DC

## 2016-09-01 MED ORDER — METOPROLOL TARTRATE 25 MG PO TABS: 25.0000 mg | ORAL_TABLET | Freq: Two times a day (BID) | ORAL | 6 refills | Status: DC

## 2016-09-01 MED ORDER — MAGNESIUM HYDROXIDE 400 MG/5ML PO SUSP: 30.0000 mL | Freq: Every day | ORAL | Status: DC | PRN

## 2016-09-01 MED ORDER — FUROSEMIDE 40 MG PO TABS: 40.0000 mg | ORAL_TABLET | Freq: Two times a day (BID) | ORAL | 6 refills | Status: DC

## 2016-09-01 MED ORDER — FUROSEMIDE 40 MG PO TABS
40.0000 mg | ORAL_TABLET | Freq: Two times a day (BID) | ORAL | Status: DC
  Administered 2016-09-01: 40 mg via ORAL
  Filled 2016-09-01: qty 1

## 2016-09-01 MED ORDER — LISINOPRIL 2.5 MG PO TABS: 2.5000 mg | ORAL_TABLET | Freq: Every day | ORAL | 6 refills | Status: AC

## 2016-09-01 MED ORDER — ATORVASTATIN CALCIUM 80 MG PO TABS: 80.0000 mg | ORAL_TABLET | Freq: Every day | ORAL | 6 refills | Status: DC

## 2016-09-01 MED ORDER — POTASSIUM CHLORIDE ER 20 MEQ PO TBCR: 20.0000 meq | EXTENDED_RELEASE_TABLET | Freq: Every day | ORAL | 6 refills | Status: DC

## 2016-09-01 MED ORDER — ATORVASTATIN CALCIUM 80 MG PO TABS: 80.0000 mg | ORAL_TABLET | Freq: Every day | ORAL | 6 refills | Status: AC

## 2016-09-01 MED ORDER — METOPROLOL TARTRATE 25 MG PO TABS: 25.0000 mg | ORAL_TABLET | Freq: Two times a day (BID) | ORAL | 6 refills | Status: AC

## 2016-09-01 MED ORDER — NITROGLYCERIN 0.4 MG SL SUBL: 0.4000 mg | SUBLINGUAL_TABLET | SUBLINGUAL | 3 refills | Status: AC | PRN

## 2016-09-01 NOTE — Care Management Note (Signed)
Case Management Note  Patient Details  Name: Ryan Berg MRN: [id number] Date of Birth: 11/16/1945  Subjective/Objective:                 Patient with order to DC to home today. Chart reviewed. No Home Health or Equipment needs, no unacknowledged Case Management consults or medication needs identified at the time of this note. Plan for DC to home. If needs arise today prior to discharge, please call Lawerance SabalDebbie Leniya Breit RN CM at [phone].    Action/Plan:   Expected Discharge Date:  09/01/16               Expected Discharge Plan:  Home/Self Care  In-House Referral:     Discharge planning Services  CM Consult  Post Acute Care Choice:    Choice offered to:     DME Arranged:    DME Agency:     HH Arranged:    HH Agency:     Status of Service:  Completed, signed off  If discussed at [employer] Length [employer] Stay Meetings, dates discussed:    Additional Comments:  Lawerance SabalDebbie Nigel Wessman, RN 09/01/2016, 8:46 AM

## 2016-09-01 NOTE — Progress Notes (Addendum)
MD stated no intervention, no straight catheter or foley catheter at this time.  MD stated okay to keep pt off telemetry  Placed pt in bathroom to try, pt states he does not need to void  MD aware

## 2016-09-01 NOTE — Discharge Instructions (Signed)
**  PLEASE REMEMBER TO BRING ALL OF YOUR MEDICATIONS TO EACH OF YOUR FOLLOW-UP OFFICE VISITS. ° °NO HEAVY LIFTING OR SEXUAL ACTIVITY X 7 DAYS. °NO DRIVING X 3-5 DAYS. °NO SOAKING BATHS, HOT TUBS, POOLS, ETC., X 7 DAYS. ° °Radial Site Care °Refer to this sheet in the next few weeks. These instructions provide you with information on caring for yourself after your procedure. Your caregiver may also give you more specific instructions. Your treatment has been planned according to current medical practices, but problems sometimes occur. Call your caregiver if you have any problems or questions after your procedure. °HOME CARE INSTRUCTIONS °You may shower the day after the procedure. Remove the bandage (dressing) and gently wash the site with plain soap and water. Gently pat the site dry.  °Do not apply powder or lotion to the site.  °Do not submerge the affected site in water for 3 to 5 days.  °Inspect the site at least twice daily.  °Do not flex or bend the affected arm for 24 hours.  °No lifting over 5 pounds (2.3 kg) for 5 days after your procedure.  °Do not drive home if you are discharged the same day of the procedure. Have someone else drive you.  ° °What to expect: °Any bruising will usually fade within 1 to 2 weeks.  °Blood that collects in the tissue (hematoma) may be painful to the touch. It should usually decrease in size and tenderness within 1 to 2 weeks.  °SEEK IMMEDIATE MEDICAL CARE IF: °You have unusual pain at the radial site.  °You have redness, warmth, swelling, or pain at the radial site.  °You have drainage (other than a small amount of blood on the dressing).  °You have chills.  °You have a fever or persistent symptoms for more than 72 hours.  °You have a fever and your symptoms suddenly get worse.  °Your arm becomes pale, cool, tingly, or numb.  °You have heavy bleeding from the site. Hold pressure on the site.  °_____________  ° °  ° °10 Habits of Highly Healthy People ° °[place] wants to help  you get well and stay well.  Live a longer, healthier life by practicing healthy habits every day. ° °1.  Visit your primary care provider regularly. °2.  Make time for family and friends.  Healthy relationships are important. °3.  Take medications as directed by your provider. °4.  Maintain a healthy weight and a trim waistline. °5.  Eat healthy meals and snacks, rich in fruits, vegetables, whole grains, and lean proteins. °6.  Get moving every day - aim for 150 minutes of moderate physical activity each week. °7.  Don't smoke. °8.  Avoid alcohol or drink in moderation. °9.  Manage stress through meditation or mindful relaxation. °10.  Get seven to nine hours of quality sleep each night. ° °Want more information on healthy habits?  To learn more about these and other healthy habits, visit [email or web address]. °_____________ °   °

## 2016-09-01 NOTE — Progress Notes (Signed)
Progress Note  Patient Name: Ryan Berg Date of Encounter: 09/01/2016  Primary Cardiologist: Dr. Herbie Baltimore   Subjective   On CPAP, appears lethargic.   Inpatient Medications    Scheduled Meds: . atorvastatin  80 mg Oral q1800  . clopidogrel  75 mg Oral Daily  . divalproex  500 mg Oral Daily  . fluticasone  1 spray Each Nare Daily  . fluticasone furoate-vilanterol  1 puff Inhalation Daily  . furosemide  40 mg Intravenous Q12H  . hydrochlorothiazide  12.5 mg Oral Daily  . lisinopril  2.5 mg Oral Daily  . metoprolol tartrate  25 mg Oral BID  . montelukast  10 mg Oral QHS  . sodium chloride flush  3 mL Intravenous Q12H  . umeclidinium bromide  1 puff Inhalation Daily   Continuous Infusions: . sodium chloride     PRN Meds: sodium chloride, acetaminophen, albuterol, ondansetron (ZOFRAN) IV, polyethylene glycol, senna-docusate, sodium chloride flush   Vital Signs    Vitals:   08/31/16 1320 08/31/16 1900 08/31/16 1937 09/01/16 0508  BP: 129/81  102/64 100/69  Pulse: 69  70 65  Resp: 18  18 18   Temp: 98.7 F (37.1 C)  98 F (36.7 C) 97.7 F (36.5 C)  TempSrc: Oral Oral Oral Oral  SpO2: 92%  93% 91%  Weight: (!) 314 lb 6.4 oz (142.6 kg)   (!) 312 lb 3.2 oz (141.6 kg)  Height: 5\' 11"  (1.803 m)       Intake/Output Summary (Last 24 hours) at 09/01/16 0747 Last data filed at 09/01/16 0600  Gross per 24 hour  Intake              960 ml  Output             4050 ml  Net            -3090 ml   Filed Weights   08/31/16 0107 08/31/16 1320 09/01/16 0508  Weight: (!) 329 lb 2.4 oz (149.3 kg) (!) 314 lb 6.4 oz (142.6 kg) (!) 312 lb 3.2 oz (141.6 kg)    Telemetry    NSR - Personally Reviewed  ECG    NSR - Personally Reviewed  Physical Exam   GEN: No acute distress. Obese , with CPAP mask Neck: No JVD Cardiac: RRR, no murmurs, rubs, or gallops.  Respiratory: Clear to auscultation bilaterally. GI: Soft, nontender, non-distended  MS: No edema; No deformity. Neuro:   Nonfocal  Psych: confusion (pt and RN report this is his baseline)   Labs    Chemistry  Recent Labs Lab 08/27/16 1505 08/31/16 0333 09/01/16 0421  NA 137 140 138  K 4.8 4.2 3.7  CL 96 104 97*  CO2 21 29 32  GLUCOSE 148* 97 108*  BUN 19 16 17   CREATININE 0.97 0.87 0.88  CALCIUM 9.7 8.8* 9.3  GFRNONAA 79 >60 >60  GFRAA 91 >60 >60  ANIONGAP  --  7 9     Hematology  Recent Labs Lab 08/27/16 1505 08/31/16 0333  WBC 7.6 7.1  RBC 4.48 4.19*  HGB 15.9 14.3  HCT 46.3 42.5  MCV 103* 101.4*  MCH 35.5* 34.1*  MCHC 34.3 33.6  RDW 13.5 13.2  PLT 209 197    Cardiac EnzymesNo results for input(s): TROPONINI in the last 168 hours. No results for input(s): TROPIPOC in the last 168 hours.   BNPNo results for input(s): BNP, PROBNP in the last 168 hours.   DDimer No results for input(s): DDIMER  in the last 168 hours.   Radiology    No results found.  Cardiac Studies   Procedures   CORONARY STENT INTERVENTION  INTRAVASCULAR PRESSURE WIRE/FFR STUDY  RIGHT/LEFT HEART CATH AND CORONARY ANGIOGRAPHY  Conclusion     Hemodynamic findings consistent with moderate pulmonary hypertension - mixed picture of primary pulmonary and secondary LV filling pressure related.  There is mild left ventricular systolic dysfunction.  LV end diastolic pressure is moderately elevated. The left ventricular ejection fraction is 45-50% by visual estimate. Relatively normal cardiac output with mildly reduced index.  There is trivial (1+) mitral regurgitation.  Prox LAD to Mid LAD lesion, 50 %stenosed.  Prox Cx stent, 0 %stenosed.  __________________________________  3 Tandem lesions in the proximal to mid RCA (FFR 0.79): Prox RCA-1 lesion, 60 %stenosed. Prox RCA-2 lesion, 65 %stenosed. Mid RCA lesion, 65 %stenosed.  A STENT SYNERGY DES 3X16 drug eluting stent was successfully placed. - postdilated to 3.6 mm  A STENT SYNERGY DES 3X20 drug-eluting stent was successfully placed. -  postdilated to 4.1 mm  A STENT SYNERGY DES 3.5X20 drug-eluting stent was successfully placed - postdilated to 4.2 mm  Post intervention, there is a 0% residual stenosis.  Mid RCA lesion, 65 %stenosed.  Post intervention, there is a 0% residual stenosis.  A STENT SYNERGY DES 3X16 drug eluting stent was successfully placed.  A STENT SYNERGY DES 3X20 drug-eluting stent was successfully placed.  A STENT SYNERGY DES 3.5X20 drug-eluting stent was successfully placed.   Angiographically confirmed data from CT angiogram with proximal to mid RCA tandem lesions with physiologically significant FFR. Successful very complex PCI with 3 DES stents placed tapered from 4.2 mm down to 3.6 mm distally. Excellent angiographic result.    Left Heart   Left Ventricle The left ventricular size is in the upper limits of normal. There is mild left ventricular systolic dysfunction. LV end diastolic pressure is moderately elevated. The left ventricular ejection fraction is 45-50% by visual estimate. There are LV function abnormalities due to global hypokinesis. There is trivial (1+) mitral regurgitation.    Aortic Valve There is no aortic valve stenosis. There is normal aortic valve motion.        Patient Profile   Ryan Berg 71 y.o. male with severe restrictive lung disease and COPD, on chronic home O2, obesity with OSA and OHS, history of coronary disease status post PCI while living in [place]. He was seen in our office recently as a new patient for shortness of breath and coronary calcification. He underwent coronary CTA with FFR that revealed a significant lesion in the RCA. Also ? pulmonary hypertension. He was referred and admitted 08/30/16 for right /left heart catheterization.  Assessment & Plan    1. CAD: Angiographically confirmed data from CT angiogram with proximal to mid RCA tandem lesions with physiologically significant FFR. S/p sSuccessful very complex PCI with 3 DES stents placed tapered  from 4.2 mm down to 3.6 mm distally. Excellent angiographic result. Residual nonobstructive 50% prox-mid LAD to be treated medically. EF 45-50% by visual estimate. Stable post cath. No chest pain. Radial cath site is stable. Continue DAPT with ASA + Plavix. Currently on Lipitor 10. Baseline LDL unknown. Given residual 50% prox-mid LAD disease, would recommend goal LDL <70 mg/dL. He is not fasting. He will need FLP in our office and further titration of statin if not at goal. Continue BB therapy with metoprolol, lisinopril added yesterday.  2. Mild Systolic HF: EF mildly reduced at 45-50%. Continue BB  therapy and ACEI added yesterday.  3. Pulmonary HTN: s/p RHC 08/30/16. Hemodynamic findings consistent with moderate pulmonary hypertension - mixed picture of primary pulmonary and secondary LV filling pressure related. LV end diastolic pressure is moderately elevated. The left ventricular ejection fraction is 45-50% by visual estimate. Relatively normal cardiac output with mildly reduced index. He has been on HCTZ as an outpatient. Renal function and K both stable. ? Change to Lasix.   4. Restrictive Lung Disease/COPD: on chronic home 02.   5. Urinary Retention: per RN report. He required placement of foley yesterday, diuresed 3 L overnight, we will remove today , if able to void, we will discharge. Start lasix 40 mg po BID, no diuretics prior to admission.   Tobias AlexanderKatarina Ashley Bultema, MDFACC 09/01/2016 7:47 AM

## 2016-09-01 NOTE — Progress Notes (Signed)
MD stated do not discharge pt until pt voids

## 2016-09-01 NOTE — Progress Notes (Signed)
CARDIAC REHAB PHASE I   PRE:  Rate/Rhythm: 60 (off tele)    BP: sitting     SaO2: 91 2L, 89-91 RA after getting dressed  MODE:  Ambulation: 200 ft   POST:  Rate/Rhythm: 77    BP: sitting      SaO2: 93 RA  Pt eager to go home but reluctant to walk. Sister encouraged him. Able to dress and walk without O2, SAO2 actually improved with walking. He uses O2 at home PRN. Rest x2 walking due to fatigue. Pt struggles with mental health and this seems to be a limiting factor. Encouraged pt to walk atleast x3 a day and increase distance. [facility]. He and his sister eat out daily therefore encouraged better eating habits, decrease sodium, more unprocessed food. He understands importance of Plavix.  [id number]   Harriet MassonRandi Kristan Braxxton Stoudt CES, ACSM 09/01/2016 12:28 PM

## 2016-09-01 NOTE — Progress Notes (Signed)
MD Delton SeeNelson stated to discontinued pt foley catheter and complete voiding trial for possible discharge today

## 2016-09-01 NOTE — Progress Notes (Signed)
MD aware pt voided, okay to discharge  Pt IV discontinued, catheter intact and telemetry removed. Pt discharge paperwork went over at bedside with pt and pt family. Pt has all belongings packed. Pt discharged by nurse staff.

## 2016-09-01 NOTE — Progress Notes (Signed)
Second bladder scan completed and documented Paged MD to inform

## 2016-09-01 NOTE — Progress Notes (Signed)
Paged MD regarding pt pharmacy unavailable until Monday, requesting printed prescriptions. MD placed new prescriptions to fill at [facility] in [place] on [address], per pt request.  Pt has all belongings and discharged via nurse tech

## 2016-09-01 NOTE — Progress Notes (Signed)
Called pt sister per pt request, update given. Pt sister on way to hospital now

## 2016-09-01 NOTE — Discharge Summary (Signed)
Discharge Summary    Patient ID: Ryan Berg,  MRN: [id number], DOB/AGE: 70/06/1945 70 y.o.  Admit date: 08/30/2016 Discharge date: 09/01/2016  Primary Care Provider: Jomarie LongsBreeback, Jade L Primary Cardiologist: Ranae Palms. Harding, MD   Discharge Diagnoses    Principal Problem:   Coronary artery disease involving native coronary artery of native heart without angina pectoris  **s/p PCI/DES to the RCA x 3 this admission.  Active Problems:   Abnormal cardiac CT angiography   Severe obstructive sleep apnea   Chronic combined systolic and diastolic heart failure (HCC)  **Discharge weight 312 lbs.   Restrictive lung disease on home O2   PAH (pulmonary artery hypertension) (HCC)   Schizoaffective disorder, depressive type (HCC)   Hyperlipidemia with target low density lipoprotein (LDL) cholesterol less than 70 mg/dL   Allergies No Known Allergies  Diagnostic Studies/Procedures    Cardiac Catheterization and Percutaneous Coronary Intervention 8.9.2018  Coronary Findings  Dominance: Right  Left Main  Vessel is large.  Left Anterior Descending  Prox LAD to Mid LAD lesion, 50% stenosed. The lesion is located at the bifurcation, eccentric and smooth.  First Diagonal Branch  Vessel is moderate in size.  Lateral First Diagonal Branch  Vessel is small in size.  First Septal Branch  Vessel is moderate in size.  Second Septal Branch  Vessel is small in size.  Third Septal Branch  Vessel is small in size.  Left Circumflex  Prox Cx lesion, 0% stenosed. Previously placed Prox Cx stent (unknown type) is widely patent.  First Obtuse Marginal Branch  Vessel is moderate in size.  Second Obtuse Marginal Branch  2nd Mrg lesion, 30% stenosed. The lesion is concentric.  Lateral Second Obtuse Marginal Branch  Vessel is small in size.  Right Coronary Artery  Vessel is large.  Prox RCA-1 lesion, 60% stenosed. The lesion is segmental.  Prox RCA-2 lesion, 65% stenosed Mid RCA lesion, 65% stenosed.    **The RCA was successfully stented with a 3.5 x 20 Synergy DES, a 3.0 x 20 Synergy DES, and a 3.0 x 16 Synergy DES. Acute Marginal Branch Vessel is small in size. Right Posterior Descending Artery Vessel is moderate in size. Inferior Septal Vessel is small in size. Right Posterior Atrioventricular Branch Vessel is moderate in size. First Right Posterolateral Vessel is small in size. Third Right Posterolateral Vessel is small in size. Right Heart  Right Heart Pressures Hemodynamic findings consistent with moderate pulmonary hypertension. PAP / Mean: 58/26/42 mmHg.  PCWP: 32 mmHg. Elevated LV EDP consistent with volume overload. LVP/EDP 120/11/22 mmHg,  AoP/MAP 124/7/95 mmHg AO sat 92%, PA sat 60%. - Cardiac output by Fick: 6.66. Cardiac index 2.56    Right Atrium mean RA pressure 14 mm;    Right Ventricle RVP 49/9/16 mmHg     Left Heart  Left Ventricle The left ventricular size is in the upper limits of normal. There is mild left ventricular systolic dysfunction. LV end diastolic pressure is moderately elevated. The left ventricular ejection fraction is 45-50% by visual estimate. There are LV function abnormalities due to global hypokinesis. There is trivial (1+) mitral regurgitation.    Aortic Valve There is no aortic valve stenosis. There is normal aortic valve motion.     _____________   History of Present Illness     71 year old male with a prior history of CAD status post prior left circumflex stenting in [place]. Other history includes restrictive lung disease, sleep apnea, hyperlipidemia, and schizoaffective disorder, bipolar type. He recently underwent  echocardiography performed by his primary care provider which showed an EF of 45-50%. He was then referred to cardiology, at which time he complained of dyspnea on exertion with minimal activity, lower extremity swelling, and two-pillow orthopnea. Given cardiomyopathy exertional symptoms, he underwent cardiac CT angiography with  fractional flow reserve, showing significant stenosis in the right coronary artery. He then followed up in clinic and arrangements were made for diagnostic catheterization.  Hospital Course     Consultants: None  Patient presented to the [facility] cardiac catheterization laboratory on 04-09-202018 and underwent right and left heart diagnostic catheterization. Right heart catheterization revealed elevated filling pressures and moderate pulmonary hypertension. Pulmonic capillary wedge pressure was markedly elevated at 32 mmHg. Diagnostic catheterization revealed tandem 60-65% stenoses throughout the proximal and mid right coronary artery. Given abnormal fractional flow reserve noted on CT, this area was successfully treated with 3 Synergy drug-eluting stents. Patient tolerated the procedure well and postprocedure, in the setting of markedly elevated filling pressures, he was admitted placed on Lasix therapy. He responded well to diuresis 3 L for this admission with roughly 10 pound weight loss and a discharge weight of 312 pounds. He did have difficulty voiding on August 10, requiring placement of a Foley catheter. Provided that he is able to void without difficulty following discontinuation of Foley catheter this morning, he'll be discharged home today in good condition. We will plan to send him home on oral Lasix therapy arranged for a basic metabolic panel in our office next week. He otherwise remains on aspirin, high potency statin, beta blocker, and ACE inhibitor therapy (started this admission). _____________  Discharge Vitals Blood pressure 100/69, pulse 70, temperature 97.7 F (36.5 C), temperature source Oral, resp. rate 18, height 5\' 11"  (1.803 m), weight (!) 312 lb 3.2 oz (141.6 kg), SpO2 92 %.  Filed Weights   08/31/16 0107 08/31/16 1320 09/01/16 0508  Weight: (!) 329 lb 2.4 oz (149.3 kg) (!) 314 lb 6.4 oz (142.6 kg) (!) 312 lb 3.2 oz (141.6 kg)    Labs & Radiologic Studies     CBC  Recent Labs  08/31/16 0333  WBC 7.1  HGB 14.3  HCT 42.5  MCV 101.4*  PLT 197   Basic Metabolic Panel  Recent Labs  08/31/16 0333 09/01/16 0421  NA 140 138  K 4.2 3.7  CL 104 97*  CO2 29 32  GLUCOSE 97 108*  BUN 16 17  CREATININE 0.87 0.88  CALCIUM 8.8* 9.3   _____________  Ct Coronary Morph W/cta Cor W/score W/ca W/cm &/or Wo/cm  Addendum Date: 08/24/2016   ADDENDUM REPORT: 08/24/2016 16:36 CLINICAL DATA:  71 year old male with cardiomyopathy of unknown etiology, Evaluate for CAD. EXAM: Cardiac/Coronary  CT TECHNIQUE: The patient was scanned on a [employer]. FINDINGS: A 100 kV prospective scan was triggered in the descending thoracic aorta at 111 HU's. Axial non-contrast 3 mm slices were carried out through the heart. The data set was analyzed on a dedicated work station and scored using the Agatson method. Gantry rotation speed was 250 msecs and collimation was 0.6 mm. No beta blockade and 0.8 mg of sl NTG was given. The 3D data set was reconstructed in 5% intervals of the 67-82 % of the R-R cycle. Diastolic phases were analyzed on a dedicated work station using MPR, MIP and VRT modes. The patient received 80 cc of contrast. Aorta:  Normal size.  No calcifications.  No dissection. Aortic Valve:  Trileaflet.  No calcifications. Coronary Arteries:  Normal  coronary origin.  Right dominance. RCA is a large dominant artery that gives rise to PDA and PLVB. There is severe diffuse mixed plaque in the proximal and mid RCA associated with at least 50-69 but possibly > 70% stenosis. Left main is a large artery that gives rise to LAD and LCX arteries. LM has mild calcified plaque in the distal portion of the artery associated with 25-50% stenosis. LAD is a medium size vessel that gives rise two diagonal branches. LAD has a moderate mixed plaque in its proximal to mid segment associated with 50-69% stenosis. After this portion the LAD lumen is very small. LCX is a medium size  non-dominant artery that has diffuse calcified plaque associated with 25-50% stenosis. Other findings: Normal pulmonary vein drainage into the left atrium. Normal let atrial appendage without a thrombus. IMPRESSION: 1. Coronary calcium score of 1425. This was 90 percentile for age and sex matched control. 2. Normal coronary origin with right dominance. 3. Diffuse plaque, at least 50-69%, but possible > 70% stenosis in the proximal and mid RCA and in the mid LAD. Additional analysis with CT FFR will be performed. Tobias Alexander Electronically Signed   By: Tobias Alexander   On: 08/24/2016 16:36   Result Date: 08/24/2016 EXAM: OVER-READ INTERPRETATION  CT CHEST The following report is an over-read performed by radiologist Dr. [facility] on 08/24/2016. This over-read does not include interpretation of cardiac or coronary anatomy or pathology. The coronary calcium score/coronary CTA interpretation by the cardiologist is attached. COMPARISON:  Chest CT 03/28/2016. FINDINGS: Aortic atherosclerosis. Mild diffuse bronchial wall thickening with mild to moderate centrilobular and mild paraseptal emphysema. Within the visualized portions of the thorax there are no suspicious appearing pulmonary nodules or masses, there is no acute consolidative airspace disease, no pleural effusions, no pneumothorax and no lymphadenopathy (borderline enlarged right hilar lymph node is stable compared to the prior study and nonspecific). Visualized portions of the upper abdomen are unremarkable. There are no aggressive appearing lytic or blastic lesions noted in the visualized portions of the skeleton. IMPRESSION: 1. Mild diffuse bronchial wall thickening with mild to moderate centrilobular and mild paraseptal emphysema; imaging findings suggestive of underlying COPD. 2. Aortic atherosclerosis. Aortic Atherosclerosis (ICD10-I70.0) and Emphysema (ICD10-J43.9). Electronically Signed: By: Trudie Reed M.D. On:  08/24/2016 15:01   Ct Coronary Fractional Flow Reserve Data Prep  Result Date: 08/27/2016 EXAM: FF/RCT ANALYSIS FINDINGS: FFRct analysis was performed on the original cardiac CT angiogram dataset. Diagrammatic representation of the FFRct analysis is provided in a separate PDF document in PACS. This dictation was created using the PDF document and an interactive 3D model of the results. 3D model is not available in the EMR/PACS. Normal FFR range is >0.80. 1. Left Main:  No significant stenosis. 2. LAD:  FFR 0.89. 3. LCX:  Not evaluated. 4. RCA: Proximal to mid RCA severe stenosis, afterwards CT FFR 0.66. IMPRESSION: 1. CT FFR analysis showed severe stenosis in the proximal to mid RCA with CT FFR 0.66. A cardiac catheterization is recommended. Tobias Alexander Electronically Signed   By: Tobias Alexander   On: 08/27/2016 11:58   Disposition   Pt is being discharged home today in good condition.  Follow-up Plans & Appointments    Follow-up Information    Ellsworth Lennox, PA-C Follow up on 09/14/2016.   Specialties:  Physician Assistant, Cardiology Why:  2:30 PM - Dr. Elissa Hefty PA Contact information: [address] [place] [address] [phone]  Marykay Lex, MD Follow up on 09/06/2016.   Specialty:  Cardiology Why:  Please present to lab @ [address] [facility] for blood chemistry only.  You will not be seeing a provider this day. Contact information: [address] [place] [address] [phone]            Discharge Medications   Current Discharge Medication List    START taking these medications   Details  furosemide (LASIX) 40 MG tablet Take 1 tablet (40 mg total) by mouth 2 (two) times daily. Qty: 60 tablet, Refills: 6    lisinopril (PRINIVIL,ZESTRIL) 2.5 MG tablet Take 1 tablet (2.5 mg total) by mouth daily. Qty: 30 tablet, Refills: 6    nitroGLYCERIN (NITROSTAT) 0.4 MG SL tablet Place 1 tablet (0.4 mg total) under the  tongue every 5 (five) minutes as needed for chest pain. Qty: 25 tablet, Refills: 3    potassium chloride 20 MEQ TBCR Take 20 mEq by mouth daily. Qty: 30 tablet, Refills: 6      CONTINUE these medications which have CHANGED   Details  atorvastatin (LIPITOR) 80 MG tablet Take 1 tablet (80 mg total) by mouth daily at 6 PM. Qty: 30 tablet, Refills: 6    metoprolol tartrate (LOPRESSOR) 25 MG tablet Take 1 tablet (25 mg total) by mouth 2 (two) times daily. Qty: 60 tablet, Refills: 6      CONTINUE these medications which have NOT CHANGED   Details  albuterol (PROVENTIL HFA;VENTOLIN HFA) 108 (90 Base) MCG/ACT inhaler Inhale 2 puffs into the lungs every 6 (six) hours as needed for wheezing or shortness of breath. Qty: 1 Inhaler, Refills: 2    clopidogrel (PLAVIX) 75 MG tablet TAKE 1 TABLET BY MOUTH EVERY DAY Qty: 31 tablet, Refills: 5    divalproex (DEPAKOTE) 500 MG DR tablet Take 500 mg by mouth daily.     DOK 100 MG capsule TAKE 1 CAPSULE BY MOUTH 2 TIMES DAILY Qty: 60 capsule, Refills: 5   Associated Diagnoses: Constipation, unspecified constipation type    Fluticasone-Umeclidin-Vilant (TRELEGY ELLIPTA) 100-62.5-25 MCG/INH AEPB Inhale 1 puff into the lungs daily. Qty: 1 each, Refills: 3   Associated Diagnoses: COPD with acute exacerbation (HCC)    mirtazapine (REMERON) 30 MG tablet Take 30 mg by mouth at bedtime.     montelukast (SINGULAIR) 10 MG tablet Take 1 tablet (10 mg total) by mouth at bedtime. Qty: 30 tablet, Refills: 11    Multiple Vitamin (MULTIVITAMIN WITH MINERALS) TABS tablet Take 1 tablet by mouth daily.    polyethylene glycol (MIRALAX / GLYCOLAX) packet Take 17 g by mouth 2 (two) times daily. Until stooling regularly Qty: 30 packet, Refills: 11   Associated Diagnoses: Acute abdominal pain in left lower quadrant    senna-docusate (SENOKOT-S) 8.6-50 MG tablet Take 2 tablets by mouth 2 (two) times daily. Until stooling regularly Qty: 60 tablet, Refills: 0    Associated Diagnoses: Acute abdominal pain in left lower quadrant    albuterol (PROVENTIL) (2.5 MG/3ML) 0.083% nebulizer solution Take 3 mLs (2.5 mg total) by nebulization every 6 (six) hours as needed for wheezing or shortness of breath. Qty: 150 mL, Refills: 1    fluticasone (FLONASE) 50 MCG/ACT nasal spray Place 1 spray into the nose daily as needed (for allergies.).       STOP taking these medications     haloperidol (HALDOL) 10 MG tablet      hydrochlorothiazide (MICROZIDE) 12.5 MG capsule  Outstanding Labs/Studies   F/u BMET in 1 wk.  Duration of Discharge Encounter   Greater than 30 minutes including physician time.  Signed, Nicolasa Ducking NP 09/01/2016, 8:54 AM

## 2016-09-01 NOTE — Progress Notes (Signed)
Paged MD regarding pt not voiding. Pt states he does not have the urge. Pt bladder scanned, MD aware. MD stated to push PO fluids and keep monitoring.

## 2016-09-03 ENCOUNTER — Other Ambulatory Visit: Payer: Self-pay | Admitting: Physician Assistant

## 2016-09-03 DIAGNOSIS — K5900 Constipation, unspecified: Secondary | ICD-10-CM

## 2016-09-04 ENCOUNTER — Telehealth: Payer: Self-pay | Admitting: *Deleted

## 2016-09-04 ENCOUNTER — Other Ambulatory Visit: Payer: Self-pay | Admitting: *Deleted

## 2016-09-04 DIAGNOSIS — Z79899 Other long term (current) drug therapy: Secondary | ICD-10-CM

## 2016-09-04 NOTE — Telephone Encounter (Signed)
Called number on file, goes to [employer]Carol Johnson, pt sister/DPR  She voiced understanding of recommendations, [facility].  She did have question, concerning pt's meds - notes his Haldol was discontinued in hospital but they were not sure why. She needs information about this to relay to patient's psychiatrist. Informed her I would route to [facility] for review.

## 2016-09-04 NOTE — Telephone Encounter (Signed)
-----   Message from [name], NP sent at 09/01/2016  8:37 AM EDT ----- Ryan Berg,  This is a pt of Dr. Elissa HeftyHarding's and he's being d/c'd today.  He needs a bmet on Thursday for combined chf.  Could you please place order?  Whenever I place orders for labs for [facility], I enter the wrong thing ([facility]/lab [facility].Marland Kitchen.it's all confusing to me).  I'll let him know to show up @ lab @ [facility].  Thanks,  [name]

## 2016-09-04 NOTE — Telephone Encounter (Signed)
   I spoke with pts sister/DPR.  Mr. Griselda MinerHadden takes haldol 10 mg chronically.  I rec that he continue taking it as previously Rx by psychiatry.  Caller verbalized understanding and was grateful for the call back.  Nicolasa Duckinghristopher Berge, NP 09/04/2016, 7:35 PM

## 2016-09-05 DIAGNOSIS — G4733 Obstructive sleep apnea (adult) (pediatric): Secondary | ICD-10-CM | POA: Diagnosis not present

## 2016-09-05 DIAGNOSIS — Z79899 Other long term (current) drug therapy: Secondary | ICD-10-CM | POA: Diagnosis not present

## 2016-09-06 LAB — BASIC METABOLIC PANEL
BUN/Creatinine Ratio: 23 (ref 10–24)
BUN: 23 mg/dL (ref 8–27)
CALCIUM: 9.5 mg/dL (ref 8.6–10.2)
CO2: 24 mmol/L (ref 20–29)
CREATININE: 0.99 mg/dL (ref 0.76–1.27)
Chloride: 97 mmol/L (ref 96–106)
GFR, EST AFRICAN AMERICAN: 89 mL/min/{1.73_m2} (ref >59)
GFR, EST NON AFRICAN AMERICAN: 77 mL/min/{1.73_m2} (ref >59)
Glucose: 128 mg/dL — ABNORMAL HIGH (ref 65–99)
Potassium: 4.4 mmol/L (ref 3.5–5.2)
Sodium: 142 mmol/L (ref 134–144)

## 2016-09-10 DIAGNOSIS — N401 Enlarged prostate with lower urinary tract symptoms: Secondary | ICD-10-CM | POA: Diagnosis not present

## 2016-09-10 DIAGNOSIS — R339 Retention of urine, unspecified: Secondary | ICD-10-CM | POA: Diagnosis not present

## 2016-09-10 DIAGNOSIS — N138 Other obstructive and reflux uropathy: Secondary | ICD-10-CM | POA: Diagnosis not present

## 2016-09-13 NOTE — Progress Notes (Signed)
Cardiology Office Note    Date:  09/14/2016   ID:  Ryan Berg, DOB 07-08-1945, MRN [id number]  PCP:  Ryan Longs, PA-C  Cardiologist: Dr. Herbie Berg   Chief Complaint  Patient presents with  . Follow-up    s/p cardiac catheterization    History of Present Illness:    Ryan Berg is a 71 y.o. male with past medical history of CAD (s/p prior stenting of LCx), chronic combined systolic and diastolic CHF (EF 63-87% by echo in 06/2016), restrctive lung disease, OSA, HTN, HLD, and schizoaffective disorder who presents to the office today for hospital follow-up.   He had been referred to Cardiology for evaluation of a reduced EF of 45-50%. A Coronary CT was obtained for initial evaluation and showed severe stenosis along the proximal to mid-RCA with CT FFR of 0.66. A cardiac catheterization was recommended for definitive evaluation and he presented to [facility] on 08/30/2016 for the procedure. This showed 50% Proximal LAD stenosis, a patent stent along the LCx, and 3 tandem lesions along the RCA with 60-65% stenosis. Complex PCI was performed with placement of 3 DES tapered from 4.2 mm to 3.6 mm distally. He will continue on ASA and Plavix indefinitely. He was started on IV Lasix following the procedure as his wedge pressure was at 33 mm Hg. He diuresed over 3L with a discharge weight of 312 lbs. He was switched to Lasix 40mg  BID at the time of discharge. A repeat BMET was obtained on 8/15 and showed stable kidney function and electrolytes.    In talking with the patient today, he reports doing well from a cardiac perspective since his recent catheterization. He denies any recurrent episodes of chest discomfort. He does have baseline dyspnea on exertion and uses supplemental oxygen as needed. He denies any orthopnea, PND, or lower extremity edema. He has been weighing himself daily and reports weights have been stable between 310-312 lbs on his home scales.  His main concern today is that he has  been experiencing worsening cognition issues. He reports not being able to follow along in a conversation. He is being followed by psychiatry and reports his Remeron dose and was recently adjusted.  Past Medical History:  Diagnosis Date  . Anxiety   . Bipolar affective (HCC)   . CAD (coronary artery disease)    a. s/p prior LCX stenting in [place];  b. 08/2016 CT/FFR: RCA 0.66; c. 08/2016 Cath/PCI: LM  nl, LAD 50p/m, LCX patent prox stent, OM2 30, RCA 60p (3.5x20 Synergy DES), 65p (3.0x20 Synergy DES), 36m (3.0x16 Synergy DES), EF 45-50%.  . Centrilobular emphysema (HCC)     Mild diffuse bronchial wall thickening with mild to moderate centrilobular and mild paraseptal emphysema suggestive of underlying COPD.  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 COPD (chronic obstructive pulmonary disease) (HCC), home O2 at 2 lpm   . Depression   . GERD (gastroesophageal reflux disease)   . HFrEF (heart failure with reduced ejection fraction) (HCC)    a. 06/2016 Echo: EF 45-50%, diff HK.  . High cholesterol   . History of hiatal hernia   . History of stomach ulcers   . Hypertension   . Ischemic cardiomyopathy    a. 06/2016 Echo: EF 45-50%, diff HK, mild LVH, Ao root 32mm,   . OSA on CPAP    "suppose to wear CPAP but I don't" (08/30/2016)  . PAH (pulmonary artery hypertension) (HCC)    a. 08/2016  RHC: PA 58/26/42.  Marland Kitchen Persistent asthma   . Pneumonia    "twice" (08/30/2016)  . Schizophrenia (HCC)   . Thoracic aortic atherosclerosis (HCC)    noted on CT    Past Surgical History:  Procedure Laterality Date  . APPENDECTOMY    . CARDIAC CATHETERIZATION    . CORONARY STENT INTERVENTION N/A 08/30/2016   Procedure: CORONARY STENT INTERVENTION;  Surgeon: Ryan Lex, MD;  Location: [facility] INVASIVE CV LAB;  Service: Cardiovascular;  Laterality: N/A;  . HERNIA REPAIR    . HIATAL HERNIA REPAIR    . INTRAVASCULAR PRESSURE WIRE/FFR STUDY N/A 08/30/2016   Procedure:  INTRAVASCULAR PRESSURE WIRE/FFR STUDY;  Surgeon: Ryan Lex, MD;  Location: [facility] INVASIVE CV LAB;  Service: Cardiovascular;  Laterality: N/A;  . RIGHT/LEFT HEART CATH AND CORONARY ANGIOGRAPHY N/A 08/30/2016   Procedure: RIGHT/LEFT HEART CATH AND CORONARY ANGIOGRAPHY;  Surgeon: Ryan Lex, MD;  Location: [facility] INVASIVE CV LAB;  Service: Cardiovascular;  Laterality: N/A;  . TRANSTHORACIC ECHOCARDIOGRAM  06/2016    Mild LVH - EF 45-50% with Diffuse Hypokinesis. Unable to determine diastolic function. Mildly dilated ascending aorta.    Current Medications: Outpatient Medications Prior to Visit  Medication Sig Dispense Refill  . albuterol (PROVENTIL HFA;VENTOLIN HFA) 108 (90 Base) MCG/ACT inhaler Inhale 2 puffs into the lungs every 6 (six) hours as needed for wheezing or shortness of breath. 1 Inhaler 2  . albuterol (PROVENTIL) (2.5 MG/3ML) 0.083% nebulizer solution Take 3 mLs (2.5 mg total) by nebulization every 6 (six) hours as needed for wheezing or shortness of breath. 150 mL 1  . atorvastatin (LIPITOR) 80 MG tablet Take 1 tablet (80 mg total) by mouth daily at 6 PM. 30 tablet 6  . clopidogrel (PLAVIX) 75 MG tablet TAKE 1 TABLET BY MOUTH EVERY DAY 31 tablet 5  . divalproex (DEPAKOTE) 500 MG DR tablet Take 500 mg by mouth daily.     Marland Kitchen docusate sodium (COLACE) 100 MG capsule TAKE 1 CAPSULE BY MOUTH 2 TIMES DAILY 60 capsule 0  . fluticasone (FLONASE) 50 MCG/ACT nasal spray Place 1 spray into the nose daily as needed (for allergies.).     Marland Kitchen Fluticasone-Umeclidin-Vilant (TRELEGY ELLIPTA) 100-62.5-25 MCG/INH AEPB Inhale 1 puff into the lungs daily. 1 each 3  . furosemide (LASIX) 40 MG tablet Take 1 tablet (40 mg total) by mouth 2 (two) times daily. 60 tablet 6  . lisinopril (PRINIVIL,ZESTRIL) 2.5 MG tablet Take 1 tablet (2.5 mg total) by mouth daily. 30 tablet 6  . metoprolol tartrate (LOPRESSOR) 25 MG tablet Take 1 tablet (25 mg total) by mouth 2 (two) times daily. 60 tablet 6  . montelukast  (SINGULAIR) 10 MG tablet Take 1 tablet (10 mg total) by mouth at bedtime. 30 tablet 11  . Multiple Vitamin (MULTIVITAMIN WITH MINERALS) TABS tablet Take 1 tablet by mouth daily.    . nitroGLYCERIN (NITROSTAT) 0.4 MG SL tablet Place 1 tablet (0.4 mg total) under the tongue every 5 (five) minutes as needed for chest pain. 25 tablet 3  . polyethylene glycol (MIRALAX / GLYCOLAX) packet Take 17 g by mouth 2 (two) times daily. Until stooling regularly (Patient taking differently: Take 17 g by mouth 2 (two) times daily as needed (for constipation.). Until stooling regularly) 30 packet 11  . potassium chloride 20 MEQ TBCR Take 20 mEq by mouth daily. 30 tablet 6  . senna-docusate (SENOKOT-S) 8.6-50 MG tablet Take 2 tablets by mouth 2 (two) times daily. Until stooling regularly (Patient taking differently:  Take 2 tablets by mouth 2 (two) times daily as needed (for constipation.). Until stooling regularly) 60 tablet 0  . mirtazapine (REMERON) 30 MG tablet Take 30 mg by mouth at bedtime.      No facility-administered medications prior to visit.      Allergies:   Patient has no known allergies.   Social History   Social History  . Marital status: Divorced    Spouse name: N/A  . Number of children: N/A  . Years of education: N/A   Occupational History  . Disabled    Social History Main Topics  . Smoking status: Former Smoker    Packs/day: 5.00    Years: 30.00    Types: Cigarettes    Quit date: 01/22/1990  . Smokeless tobacco: Never Used  . Alcohol use Yes     Comment: 08/30/2016 "very seldom anymore; h/o abuse"  . Drug use: Yes    Types: Marijuana     Comment: 08/30/2016 "very seldom anymore; h/o abuse"  . Sexual activity: No   Other Topics Concern  . None   Social History Narrative   Pt lives with sister in an apartment. He moved here from [place] in June of 2017.     Family History:  The patient's family history includes Heart disease (age of onset: 34) in his mother; Heart disease (age  of onset: 62) in his father; Hypertension in his father and mother.   Review of Systems:   Please see the history of present illness.     General:  No chills, fever, night sweats or weight changes.  Cardiovascular:  No chest pain, edema, orthopnea, palpitations, paroxysmal nocturnal dyspnea. Positive for dyspnea on exertion.  Dermatological: No rash, lesions/masses Respiratory: No cough, Positive for dyspnea Urologic: No hematuria, dysuria Abdominal:   No nausea, vomiting, diarrhea, bright red blood per rectum, melena, or hematemesis Neurologic:  No visual changes or wkns. Positive for changes in mental status. All other systems reviewed and are otherwise negative except as noted above.   Physical Exam:    VS:  BP 103/76   Pulse 81   Ht 5\' 11"  (1.803 m)   Wt (!) 316 lb 12.8 oz (143.7 kg)   BMI 44.18 kg/m    General: Well developed, obese Caucasian male appearing in no acute distress. Head: Normocephalic, atraumatic, sclera non-icteric, no xanthomas, nares are without discharge.  Neck: No carotid bruits. JVD unable to be assessed secondary to body habitus.  Lungs: Respirations regular and unlabored, without wheezes or rales.  Heart: Regular rate and rhythm. No S3 or S4.  No murmur, no rubs, or gallops appreciated. Abdomen: Soft, non-tender, non-distended with normoactive bowel sounds. No hepatomegaly. No rebound/guarding. No obvious abdominal masses. Msk:  Strength and tone appear normal for age. No joint deformities or effusions. Extremities: No clubbing or cyanosis. No lower extremity edema.  Distal pedal pulses are 2+ bilaterally. Neuro: Alert and oriented X 3. Moves all extremities spontaneously. No focal deficits noted. Psych:  Responds to questions appropriately with a normal affect. Skin: No rashes or lesions noted  Wt Readings from Last 3 Encounters:  09/14/16 (!) 316 lb 12.8 oz (143.7 kg)  09/01/16 (!) 312 lb 3.2 oz (141.6 kg)  08/27/16 (!) 322 lb (146.1 kg)      Studies/Labs Reviewed:   EKG:  EKG is ordered today.  The ekg ordered today demonstrates SR with 1st degree AV block and PAC's. Known RBBB and LAFB. No acute changes when compared to prior tracings.  Recent Labs: 06/03/2016: Brain Natriuretic Peptide <4 06/08/2016: ALT 23 08/31/2016: Hemoglobin 14.3; Platelets 197 09/05/2016: BUN 23; Creatinine, Ser 0.99; Potassium 4.4; Sodium 142   Lipid Panel No results found for: CHOL, TRIG, HDL, CHOLHDL, VLDL, LDLCALC, LDLDIRECT  Additional studies/ records that were reviewed today include:   Cardiac Catheterization: 08/30/2016  Hemodynamic findings consistent with moderate pulmonary hypertension - mixed picture of primary pulmonary and secondary LV filling pressure related.  There is mild left ventricular systolic dysfunction.  LV end diastolic pressure is moderately elevated. The left ventricular ejection fraction is 45-50% by visual estimate. Relatively normal cardiac output with mildly reduced index.  There is trivial (1+) mitral regurgitation.  Prox LAD to Mid LAD lesion, 50 %stenosed.  Prox Cx stent, 0 %stenosed.  __________________________________  3 Tandem lesions in the proximal to mid RCA (FFR 0.79): Prox RCA-1 lesion, 60 %stenosed. Prox RCA-2 lesion, 65 %stenosed. Mid RCA lesion, 65 %stenosed.  A STENT SYNERGY DES 3X16 drug eluting stent was successfully placed. - postdilated to 3.6 mm  A STENT SYNERGY DES 3X20 drug-eluting stent was successfully placed. - postdilated to 4.1 mm  A STENT SYNERGY DES 3.5X20 drug-eluting stent was successfully placed - postdilated to 4.2 mm  Post intervention, there is a 0% residual stenosis.  Mid RCA lesion, 65 %stenosed.  Post intervention, there is a 0% residual stenosis.  A STENT SYNERGY DES 3X16 drug eluting stent was successfully placed.  A STENT SYNERGY DES 3X20 drug-eluting stent was successfully placed.  A STENT SYNERGY DES 3.5X20 drug-eluting stent was successfully placed.    Angiographically confirmed data from CT angiogram with proximal to mid RCA tandem lesions with physiologically significant FFR. Successful very complex PCI with 3 DES stents placed tapered from 4.2 mm down to 3.6 mm distally. Excellent angiographic result.  Plan:  Patient will be observed overnight post PCI. Home medications will be continued.  He is artery on aspirin and Plavix which will be continued indefinitely.  He will need continued blood pressure and statin management. I have increased atorvastatin to 40 mg daily.  Anticipate discharge tomorrow morning   Assessment:    1. Coronary artery disease involving native coronary artery of native heart without angina pectoris   2. Chronic combined systolic and diastolic heart failure (HCC)   3. Hyperlipidemia with target low density lipoprotein (LDL) cholesterol less than 70 mg/dL   4. Essential hypertension   5. Restrictive lung disease   6. Obesity, morbid, BMI 40.0-49.9 (HCC)   7. Schizoaffective disorder, depressive type (HCC)      Plan:   In order of problems listed above:  1. CAD - he is s/p prior stenting of LCx. Evaluated by Cardiology for a reduced EF with Coronary CT showing a significant FFR of 0.66 along the RCA. A cardiac catheterization was performed on 08/30/2016 and showed 50% Proximal LAD stenosis, a patent stent along the LCx, and 3 tandem lesions along the RCA with 60-65% stenosis. Complex PCI was performed with placement of 3 DES tapered from 4.2 mm to 3.6 mm distally.  - he denies any recurrent chest pain. Reports improvement in his fatigue. Has baseline dyspnea on exertion but denies any acute changes in his symptoms. - continue ASA (unclear if he is taking this as it is not on his current medication list and he is unsure, therefore will restart) and Plavix indefinitely. Continue BB and statin therapy.  2. Chronic Combined Systolic and Diastolic CHF - EF was at 45-50% by echo in 06/2016. Wedge pressure at  33  mmHg during recent cath and he required administration of IV Lasix. Has continued on Lasix 40mg  BID and volume status appears close to baseline by examination. Weights have been stable on this home scales.  - sodium and fluid restriction reviewed with the patient. - continue Lisinopril 2.5mg  daily and Lopressor 25mg  BID. Unable to titrate secondary to borderline hypotension. Recent BMET showed stable kidney function and electrolytes, therefore continue with Lasix 40mg  BID.   3. HLD - no recent FLP on file. Goal LDL is < 70 with known CAD. - continue Lipitor 80mg  daily. Will need to return for FLP and LFT's as he is not fasting today.   4. HTN - BP soft at 103/76 during today's visit. No recent symptoms. - was encouraged to continue to follow BP closely at home. Continue current medication regimen.   5. Restrictive Lung Disease - followed by Pulmonology. Uses 2L [place] at night and PRN throughout the day.   6. Morbid Obesity - BMI > 44.0 - continued diet and exercise encouraged. Exercise limited by joint pain.  7. Schizoaffective Disorder - notes worsening cognition issues since changes were made in his Remeron dosing. - remains on Depakote, Remeron, and Haldol. - followed by Psychiatry.    Medication Adjustments/Labs and Tests Ordered: Current medicines are reviewed at length with the patient today.  Concerns regarding medicines are outlined above.  Medication changes, Labs and Tests ordered today are listed in the Patient Instructions below. Patient Instructions  Medication Instructions: RESTART aspirin 81 mg tablet daily. CONTINUE clopidogrel (plavix) daily.  If you need a refill on your cardiac medications before your next appointment, please call your pharmacy.   Follow-Up: Your physician wants you to follow-up in: 3 months with Dr. Herbie Berg. You will receive a reminder letter in the mail two months in advance. If you don't receive a letter, please call our office to schedule this  follow-up appointment.  Thank you for choosing Heartcare at [facility]!!      Signed, Ellsworth Lennox, PA-C  09/14/2016 10:30 PM    [facility] [address] [place], [address] [place], [place]  [address] Phone: [phone]; Fax: [phone]  [address] [place], [place] [address] Phone: [phone]

## 2016-09-14 ENCOUNTER — Encounter: Payer: Self-pay | Admitting: Student

## 2016-09-14 ENCOUNTER — Ambulatory Visit (INDEPENDENT_AMBULATORY_CARE_PROVIDER_SITE_OTHER): Payer: PPO | Admitting: Student

## 2016-09-14 VITALS — BP 103/76 | HR 81 | Ht 71.0 in | Wt 316.8 lb

## 2016-09-14 DIAGNOSIS — I2510 Atherosclerotic heart disease of native coronary artery without angina pectoris: Secondary | ICD-10-CM | POA: Diagnosis not present

## 2016-09-14 DIAGNOSIS — G4733 Obstructive sleep apnea (adult) (pediatric): Secondary | ICD-10-CM | POA: Diagnosis not present

## 2016-09-14 DIAGNOSIS — I5042 Chronic combined systolic (congestive) and diastolic (congestive) heart failure: Secondary | ICD-10-CM | POA: Diagnosis not present

## 2016-09-14 DIAGNOSIS — I10 Essential (primary) hypertension: Secondary | ICD-10-CM

## 2016-09-14 DIAGNOSIS — E785 Hyperlipidemia, unspecified: Secondary | ICD-10-CM

## 2016-09-14 DIAGNOSIS — J984 Other disorders of lung: Secondary | ICD-10-CM | POA: Diagnosis not present

## 2016-09-14 DIAGNOSIS — F251 Schizoaffective disorder, depressive type: Secondary | ICD-10-CM | POA: Diagnosis not present

## 2016-09-14 MED ORDER — ASPIRIN EC 81 MG PO TBEC: 81.0000 mg | DELAYED_RELEASE_TABLET | Freq: Every day | ORAL | 3 refills | Status: AC

## 2016-09-14 NOTE — Patient Instructions (Signed)
Medication Instructions: RESTART aspirin 81 mg tablet daily. CONTINUE clopidogrel (plavix) daily.  If you need a refill on your cardiac medications before your next appointment, please call your pharmacy.   Follow-Up: Your physician wants you to follow-up in: 3 months with Dr. Herbie Baltimore. You will receive a reminder letter in the mail two months in advance. If you don't receive a letter, please call our office to schedule this follow-up appointment.     Thank you for choosing Heartcare at [facility]!!

## 2016-09-18 DIAGNOSIS — N138 Other obstructive and reflux uropathy: Secondary | ICD-10-CM | POA: Diagnosis not present

## 2016-09-18 DIAGNOSIS — N401 Enlarged prostate with lower urinary tract symptoms: Secondary | ICD-10-CM | POA: Diagnosis not present

## 2016-09-26 ENCOUNTER — Telehealth: Payer: Self-pay | Admitting: *Deleted

## 2016-09-26 NOTE — Telephone Encounter (Signed)
FAXED HEART STRIDE PHYSICIAN REFERRAL FORM SIGNED BY DR HARDING.

## 2016-10-02 ENCOUNTER — Other Ambulatory Visit: Payer: Self-pay | Admitting: Physician Assistant

## 2016-10-02 DIAGNOSIS — N138 Other obstructive and reflux uropathy: Secondary | ICD-10-CM | POA: Diagnosis not present

## 2016-10-02 DIAGNOSIS — N401 Enlarged prostate with lower urinary tract symptoms: Secondary | ICD-10-CM | POA: Diagnosis not present

## 2016-10-02 DIAGNOSIS — R339 Retention of urine, unspecified: Secondary | ICD-10-CM | POA: Diagnosis not present

## 2016-10-02 DIAGNOSIS — J441 Chronic obstructive pulmonary disease with (acute) exacerbation: Secondary | ICD-10-CM

## 2016-10-02 DIAGNOSIS — K5900 Constipation, unspecified: Secondary | ICD-10-CM

## 2016-10-06 DIAGNOSIS — G4733 Obstructive sleep apnea (adult) (pediatric): Secondary | ICD-10-CM | POA: Diagnosis not present

## 2016-10-15 DIAGNOSIS — G4733 Obstructive sleep apnea (adult) (pediatric): Secondary | ICD-10-CM | POA: Diagnosis not present

## 2016-10-16 DIAGNOSIS — R339 Retention of urine, unspecified: Secondary | ICD-10-CM | POA: Diagnosis not present

## 2016-10-16 DIAGNOSIS — R310 Gross hematuria: Secondary | ICD-10-CM | POA: Diagnosis not present

## 2016-11-05 DIAGNOSIS — G4733 Obstructive sleep apnea (adult) (pediatric): Secondary | ICD-10-CM | POA: Diagnosis not present

## 2016-11-06 DIAGNOSIS — N401 Enlarged prostate with lower urinary tract symptoms: Secondary | ICD-10-CM | POA: Diagnosis not present

## 2016-11-06 DIAGNOSIS — N138 Other obstructive and reflux uropathy: Secondary | ICD-10-CM | POA: Diagnosis not present

## 2016-11-07 ENCOUNTER — Telehealth: Payer: Self-pay | Admitting: Cardiology

## 2016-11-07 NOTE — Telephone Encounter (Signed)
Per Boyce MediciBrittany Simmons, PA pt is not cleared for surgery. Pt has had drug eluting stents placed during cath 08/30/16. Per PA pt will need to be on antiplatelet therapy for at least 1 year. I have s/w Dr. Gerlene Burdockichard Puschinsky office to inform pt is not cleared for procedure. Pt does have appt 12/17/16 with Dr. Herbie BaltimoreHarding. I will route information over to Dr. Gerome SamPuschinsky to advise pt not cleared.

## 2016-11-07 NOTE — Telephone Encounter (Signed)
° °  [facility] Pre-operative Risk Assessment    Request for surgical clearance:  1. What type of surgery is being performed?TURP   2. When is this surgery scheduled? Pending   3. Are there any medications that need to be held prior to surgery and how long?Can pt hold their Aspirn for 2 days prior to surgery? Can they hold their Plavix for 5 days prior to surgery   4. Practice name and name of physician performing surgery? Dr Delfino Lovett Puschinsky   5. What is your office phone and fax number? [phone] and fax is [phone]   6. Anesthesia type (None, local, MAC, general) ?    Glyn Ade 11/07/2016, 11:10 AM  _________________________________________________________________   (provider comments below)

## 2016-11-07 NOTE — Telephone Encounter (Signed)
    Chart reviewed as part of pre-operative protocol coverage. Patient was contacted 11/07/2016 in reference to pre-operative risk assessment for pending surgery as outlined below.   Request was sent by Dr. Gerlene Burdockichard Puschinsky regarding clearance for TURP for symptomatic BPH. This would be an elective procedure. I spoke with pt's wife. Pt not available at time of call. Pt with nocturnal voiding symptoms but no OBO. Not an urgent need for surgery.   Pt has undergone recent DES placement 08/2016 and is on DAPT with ASA and Plavix.   [facility] regarding peri operative antiplatelet management   Stent implantation <4-6 weeks = No  DES >30 days but <365 days = Yes  Risk of Surgical Delay is Greater than Risk of DES Thrombosis = No  Recommendations: Delay surgery until after optimal period (BMS: 30 days and DES: 365 days)  Pt is not cleared for surgery at this time. Pt notified that we will have to delay elective surgery until he can be cleared by Dr. Herbie BaltimoreHarding. He has a f/u visit with Dr. Herbie BaltimoreHarding on 12/17/16 and can further discuss at that time.   Pre-op covering staff: - Please contact requesting surgeon's office via preferred method (i.e, phone, fax) to inform them that pt cannot have TURP at this time for the above reasons.   Robbie LisBrittainy Tilden Broz, PA-C 11/07/2016, 2:23 PM

## 2016-11-10 NOTE — Telephone Encounter (Signed)
Agree with delaying elective Sgx.    Can discuss options in Nov.  Bryan Lemmaavid Harding, MD

## 2016-11-12 ENCOUNTER — Encounter: Payer: Self-pay | Admitting: Physician Assistant

## 2016-11-12 ENCOUNTER — Ambulatory Visit (INDEPENDENT_AMBULATORY_CARE_PROVIDER_SITE_OTHER): Payer: PPO | Admitting: Physician Assistant

## 2016-11-12 ENCOUNTER — Ambulatory Visit: Payer: PPO | Admitting: Physician Assistant

## 2016-11-12 VITALS — BP 108/60 | HR 74 | Temp 98.5°F | Ht 70.98 in | Wt 312.0 lb

## 2016-11-12 DIAGNOSIS — J984 Other disorders of lung: Secondary | ICD-10-CM

## 2016-11-12 DIAGNOSIS — J4551 Severe persistent asthma with (acute) exacerbation: Secondary | ICD-10-CM | POA: Diagnosis not present

## 2016-11-12 DIAGNOSIS — J441 Chronic obstructive pulmonary disease with (acute) exacerbation: Secondary | ICD-10-CM | POA: Diagnosis not present

## 2016-11-12 DIAGNOSIS — G4733 Obstructive sleep apnea (adult) (pediatric): Secondary | ICD-10-CM

## 2016-11-12 MED ORDER — PREDNISONE 50 MG PO TABS: ORAL_TABLET | ORAL | 0 refills | Status: DC

## 2016-11-12 MED ORDER — DOXYCYCLINE HYCLATE 100 MG PO TABS: 100.0000 mg | ORAL_TABLET | Freq: Two times a day (BID) | ORAL | 0 refills | Status: DC

## 2016-11-12 MED ORDER — METHYLPREDNISOLONE SODIUM SUCC 125 MG IJ SOLR
125.0000 mg | Freq: Once | INTRAMUSCULAR | Status: AC
  Administered 2016-11-12: 125 mg via INTRAMUSCULAR

## 2016-11-12 NOTE — Progress Notes (Signed)
Subjective:    Patient ID: Ryan Berg, male    DOB: 1945/06/23, 71 y.o.   MRN: [id number]  HPI  Pt is a 71 yo male with COPD, asthma, restictive lung disease and on 2L of continuous O2 with daily Trelegy and as needed albuterol c/o increased SOB, ST, congestion, worsening cough for the last 1-2 weeks. Symptoms started with ST that has exacerbated into this. No fever, chills, n/v/d. Tried mucinex with little relief. On trelegy daily. Using albuterol a few times a day with some relief. Pt has a hx of pneumonia.   .. Active Ambulatory Problems    Diagnosis Date Noted  . Schizoaffective disorder, depressive type (HCC) 10/12/2015  . Swelling of limb 10/14/2015  . Asthma, mild persistent 10/14/2015  . Snoring 10/14/2015  . Obesity 10/14/2015  . Sleeping difficulties 10/14/2015  . Coronary artery disease involving native coronary artery of native heart without angina pectoris 10/14/2015  . Dyspnea 10/17/2015  . Severe obstructive sleep apnea 11/14/2015  . Acquired buried penis 03/27/2016  . Chronic asthma 03/27/2016  . Hypoxemia 03/27/2016  . Cough 03/27/2016  . Pulmonary nodules/lesions, multiple 03/27/2016  . Severe persistent asthma with acute exacerbation 06/12/2016  . Chronic combined systolic and diastolic heart failure (HCC) 07/06/2016  . Centrilobular emphysema (HCC)   . Abnormal cardiac CT angiography 08/27/2016  . Restrictive lung disease 08/27/2016  . Hyperlipidemia with target low density lipoprotein (LDL) cholesterol less than 70 mg/dL 40/98/1191  . CAD S/P percutaneous coronary angioplasty 30-Nov-202018  . PAH (pulmonary artery hypertension) (HCC) 09/01/2016   Resolved Ambulatory Problems    Diagnosis Date Noted  . Acute abdominal pain in left lower quadrant 11/22/2015   Past Medical History:  Diagnosis Date  . Anxiety   . Bipolar affective (HCC)   . CAD (coronary artery disease)   . Centrilobular emphysema (HCC)   . 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 COPD (chronic obstructive pulmonary disease) (HCC), home O2 at 2 lpm   . Depression   . GERD (gastroesophageal reflux disease)   . HFrEF (heart failure with reduced ejection fraction) (HCC)   . High cholesterol   . History of hiatal hernia   . History of stomach ulcers   . Hypertension   . Ischemic cardiomyopathy   . OSA on CPAP   . PAH (pulmonary artery hypertension) (HCC)   . Persistent asthma   . Pneumonia   . Schizophrenia (HCC)   . Thoracic aortic atherosclerosis (HCC)       Review of Systems  All other systems reviewed and are negative.      Objective:   Physical Exam  Constitutional: He is oriented to person, place, and time. He appears well-developed and well-nourished.  Morbidly obese.   HENT:  Head: Normocephalic and atraumatic.  Right Ear: External ear normal.  Left Ear: External ear normal.  Nose: Nose normal.  Mouth/Throat: No oropharyngeal exudate.  TM"s clear bilaterally.  Negative for any maxillary or frontal sinus tenderness to palpation.  Oropharynx erythematous without tonsil enlargment or exudate.   Eyes: Conjunctivae are normal. Right eye exhibits no discharge. Left eye exhibits no discharge.  Neck: Normal range of motion. Neck supple.  Cardiovascular: Normal rate, regular rhythm and normal heart sounds.   Pulmonary/Chest: Effort normal.  Coarse rhonchi bilateral lungs with scattered expiratory wheezing. No rales.   Lymphadenopathy:    He has no cervical adenopathy.  Neurological: He is alert and oriented to person, place, and time.  Psychiatric:  Flat affect.  Assessment & Plan:  Marland Kitchen.Marland Kitchen.Marina Goodellerry was seen today for sore throat.  Diagnoses and all orders for this visit:  COPD exacerbation (HCC) -     predniSONE (DELTASONE) 50 MG tablet; Take one tablet for 5 days. -     doxycycline (VIBRA-TABS) 100 MG tablet; Take 1 tablet (100 mg total) by mouth 2 (two) times daily. For 10 days. -     methylPREDNISolone sodium succinate  (SOLU-MEDROL) 125 mg/2 mL injection 125 mg; Inject 2 mLs (125 mg total) into the muscle once.  Restrictive lung disease -     predniSONE (DELTASONE) 50 MG tablet; Take one tablet for 5 days.  Severe obstructive sleep apnea  Severe persistent asthma with acute exacerbation -     predniSONE (DELTASONE) 50 MG tablet; Take one tablet for 5 days. -     methylPREDNISolone sodium succinate (SOLU-MEDROL) 125 mg/2 mL injection 125 mg; Inject 2 mLs (125 mg total) into the muscle once.  certainly feel like pt encounter a virus that has gone into chest and now exacerbating chronic pulmonary issues:  Pt has a strong hx of pneumonia.  Pulse ox as long as on 02 and breathing out of nose looks good.  Solumedrol 125mg  given today in office.  Start prednisone burst tomorrow. zpak has interaction with Haldol. Will treat with doxycycline for 10 days.  Pt has follow up with pulmonologist on Thursday which will be great for follow up.  Continue on O2 at 2L with goal of pulse ox being above 90 percent.   Pt does mention "how down he feels" discussed being sick could worsen his down days. Encourage close follow up with psychiatrist for medication review. Pt denies suicidal thoughts.

## 2016-11-14 DIAGNOSIS — G4733 Obstructive sleep apnea (adult) (pediatric): Secondary | ICD-10-CM | POA: Diagnosis not present

## 2016-11-20 DIAGNOSIS — I2721 Secondary pulmonary arterial hypertension: Secondary | ICD-10-CM | POA: Diagnosis not present

## 2016-11-20 DIAGNOSIS — J4530 Mild persistent asthma, uncomplicated: Secondary | ICD-10-CM | POA: Diagnosis not present

## 2016-11-20 DIAGNOSIS — R0602 Shortness of breath: Secondary | ICD-10-CM | POA: Diagnosis not present

## 2016-11-20 DIAGNOSIS — R918 Other nonspecific abnormal finding of lung field: Secondary | ICD-10-CM | POA: Diagnosis not present

## 2016-11-20 DIAGNOSIS — G4733 Obstructive sleep apnea (adult) (pediatric): Secondary | ICD-10-CM | POA: Diagnosis not present

## 2016-11-20 DIAGNOSIS — I517 Cardiomegaly: Secondary | ICD-10-CM | POA: Diagnosis not present

## 2016-11-20 DIAGNOSIS — J432 Centrilobular emphysema: Secondary | ICD-10-CM | POA: Diagnosis not present

## 2016-11-27 DIAGNOSIS — F250 Schizoaffective disorder, bipolar type: Secondary | ICD-10-CM | POA: Diagnosis not present

## 2016-12-04 DIAGNOSIS — F250 Schizoaffective disorder, bipolar type: Secondary | ICD-10-CM | POA: Diagnosis not present

## 2016-12-05 ENCOUNTER — Other Ambulatory Visit: Payer: Self-pay | Admitting: Physician Assistant

## 2016-12-06 ENCOUNTER — Other Ambulatory Visit: Payer: Self-pay | Admitting: *Deleted

## 2016-12-06 DIAGNOSIS — G4733 Obstructive sleep apnea (adult) (pediatric): Secondary | ICD-10-CM | POA: Diagnosis not present

## 2016-12-15 DIAGNOSIS — G4733 Obstructive sleep apnea (adult) (pediatric): Secondary | ICD-10-CM | POA: Diagnosis not present

## 2016-12-17 ENCOUNTER — Ambulatory Visit: Payer: PPO | Admitting: Cardiology

## 2016-12-17 ENCOUNTER — Encounter: Payer: Self-pay | Admitting: Cardiology

## 2016-12-17 VITALS — BP 94/64 | HR 77 | Ht 71.0 in | Wt 313.0 lb

## 2016-12-17 DIAGNOSIS — M7989 Other specified soft tissue disorders: Secondary | ICD-10-CM | POA: Diagnosis not present

## 2016-12-17 DIAGNOSIS — E785 Hyperlipidemia, unspecified: Secondary | ICD-10-CM | POA: Diagnosis not present

## 2016-12-17 DIAGNOSIS — I5042 Chronic combined systolic (congestive) and diastolic (congestive) heart failure: Secondary | ICD-10-CM

## 2016-12-17 DIAGNOSIS — I2721 Secondary pulmonary arterial hypertension: Secondary | ICD-10-CM | POA: Diagnosis not present

## 2016-12-17 DIAGNOSIS — J984 Other disorders of lung: Secondary | ICD-10-CM | POA: Diagnosis not present

## 2016-12-17 DIAGNOSIS — Z9861 Coronary angioplasty status: Secondary | ICD-10-CM

## 2016-12-17 DIAGNOSIS — I2510 Atherosclerotic heart disease of native coronary artery without angina pectoris: Secondary | ICD-10-CM

## 2016-12-17 DIAGNOSIS — G4733 Obstructive sleep apnea (adult) (pediatric): Secondary | ICD-10-CM | POA: Diagnosis not present
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
## 2016-12-17 NOTE — Patient Instructions (Addendum)
Medication Instructions:  Take your Lisinopril at dinner time  Ok to take extra dose of furosemide (Lasix) as needed for swelling.   Labwork: Please return for FASTING labs (CMET, Lipid)-lab orders provided.  Follow-Up: Your physician wants you to follow-up in: 6 months with Dr. Herbie BaltimoreHarding. You will receive a reminder letter in the mail two months in advance. If you don't receive a letter, please call our office to schedule the follow-up appointment.   Any Other Special Instructions Will Be Listed Below (If Applicable).     If you need a refill on your cardiac medications before your next appointment, please call your pharmacy.

## 2016-12-19 ENCOUNTER — Other Ambulatory Visit: Payer: Self-pay | Admitting: *Deleted

## 2016-12-19 ENCOUNTER — Encounter: Payer: Self-pay | Admitting: Cardiology

## 2016-12-19 MED ORDER — AMBULATORY NON FORMULARY MEDICATION: 0 refills | Status: AC

## 2016-12-19 NOTE — Assessment & Plan Note (Addendum)
Patent circumflex stents with severe disease in the proximal to mid RCA treated with overlapping stents.  Quite a difficult PCI but good result. Unfortunately, does not really seem like it helped his dyspnea that much. I think his lung disease is bad enough that they can only serve to help him to a mild amount. He is on low-dose ACE inhibitor which he seems to be tolerating okay (could convert to ARB) along with low-dose beta-blocker. On aspirin and Plavix along with high-dose atorvastatin.  --Needs to stay on dual antiplatelet therapy for at least 6 months at which time it would be okay to potentially hold aspirin if necessary and if for an urgent reason could hold Plavix   For short-term.

## 2016-12-19 NOTE — Assessment & Plan Note (Signed)
Right heart catheterization numbers of moderate pulmonary hypertension there was combination of both primary and secondary.  Continuing diuretic plus afterload reduction will help minimize the secondary component.  Will defer treatment respiratory disease to his other physicians.

## 2016-12-19 NOTE — Assessment & Plan Note (Signed)
He needs to continue to use his CPAP.  We talked about the importance of continuing this to avoid further progression of disease.

## 2016-12-19 NOTE — Assessment & Plan Note (Signed)
Moderate pulmonary branch noted on right heart cath.  Likely a combination of high left ventricular filling pressures and baseline lung disease. For now Blood pressure limits further treatment beyond diuresis and blood pressure control.

## 2016-12-19 NOTE — Assessment & Plan Note (Signed)
Patent circumflex stent positive CT FFR of the right coronary was confirmed by cardiac catheterization. He is on stable regimen at this point, provided his lipids are well controlled.  Aspirin plus Plavix.  High-dose statin.  ACE inhibitor and beta-blocker stable doses.

## 2016-12-19 NOTE — Assessment & Plan Note (Signed)
He does have some mild lower extremity swelling, but seems to be relatively euvolemic.  He is on twice daily Lasix and we talked about sliding scale.  Would like to be to convert him to once daily metoprolol over time, but for now we will leave his current dose in order to allow for titration. He is also on ACE inhibitor which we will have him take at dinnertime as opposed in the morning because of his dizziness.  Low blood pressure will not allow us to adjust any further.

## 2016-12-19 NOTE — Progress Notes (Signed)
Pt's sister called requesting a shower chair.  [facility]

## 2016-12-19 NOTE — Assessment & Plan Note (Addendum)
I have not seen recent labs.  However based on his Findings, we increase his atorvastatin to 80mg .  Should be due for follow-up shortly if not already checked.  Will need to follow-up results.  Target LDL now is clearly less than 70. We will actually order follow-up lipid panel just to confirm.

## 2017-01-05 DIAGNOSIS — G4733 Obstructive sleep apnea (adult) (pediatric): Secondary | ICD-10-CM | POA: Diagnosis not present

## 2017-01-14 DIAGNOSIS — G4733 Obstructive sleep apnea (adult) (pediatric): Secondary | ICD-10-CM | POA: Diagnosis not present

## 2017-01-16 ENCOUNTER — Other Ambulatory Visit: Payer: Self-pay | Admitting: Physician Assistant

## 2017-02-05 DIAGNOSIS — G4733 Obstructive sleep apnea (adult) (pediatric): Secondary | ICD-10-CM | POA: Diagnosis not present

## 2017-02-14 DIAGNOSIS — G4733 Obstructive sleep apnea (adult) (pediatric): Secondary | ICD-10-CM | POA: Diagnosis not present

## 2017-02-18 ENCOUNTER — Other Ambulatory Visit: Payer: Self-pay | Admitting: Sports Medicine

## 2017-02-18 DIAGNOSIS — R1032 Left lower quadrant pain: Secondary | ICD-10-CM

## 2017-02-18 NOTE — Telephone Encounter (Signed)
To PCP

## 2017-02-25 ENCOUNTER — Encounter: Payer: Self-pay | Admitting: Physician Assistant

## 2017-02-25 ENCOUNTER — Ambulatory Visit (INDEPENDENT_AMBULATORY_CARE_PROVIDER_SITE_OTHER): Payer: PPO | Admitting: Physician Assistant

## 2017-02-25 VITALS — BP 123/66 | HR 68 | Ht 71.0 in | Wt 307.0 lb

## 2017-02-25 DIAGNOSIS — I5042 Chronic combined systolic (congestive) and diastolic (congestive) heart failure: Secondary | ICD-10-CM | POA: Diagnosis not present

## 2017-02-25 DIAGNOSIS — F251 Schizoaffective disorder, depressive type: Secondary | ICD-10-CM

## 2017-02-25 DIAGNOSIS — F315 Bipolar disorder, current episode depressed, severe, with psychotic features: Secondary | ICD-10-CM

## 2017-02-25 DIAGNOSIS — I5022 Chronic systolic (congestive) heart failure: Secondary | ICD-10-CM

## 2017-02-25 DIAGNOSIS — J432 Centrilobular emphysema: Secondary | ICD-10-CM

## 2017-02-25 DIAGNOSIS — F200 Paranoid schizophrenia: Secondary | ICD-10-CM | POA: Diagnosis not present

## 2017-02-25 DIAGNOSIS — G4733 Obstructive sleep apnea (adult) (pediatric): Secondary | ICD-10-CM | POA: Diagnosis not present

## 2017-02-25 DIAGNOSIS — F319 Bipolar disorder, unspecified: Secondary | ICD-10-CM | POA: Insufficient documentation

## 2017-02-25 DIAGNOSIS — Z23 Encounter for immunization: Secondary | ICD-10-CM

## 2017-02-25 DIAGNOSIS — I5020 Unspecified systolic (congestive) heart failure: Secondary | ICD-10-CM | POA: Insufficient documentation

## 2017-02-25 DIAGNOSIS — F209 Schizophrenia, unspecified: Secondary | ICD-10-CM | POA: Insufficient documentation

## 2017-02-25 NOTE — Progress Notes (Signed)
Subjective:    Patient ID: Ryan Berg, male    DOB: 07/10/1945, 72 y.o.   MRN: [id number]  HPI Patient is a 72 year old male with extensive PMH see below, who presents to the clinic with his sister.  They would like to discuss getting him into a long-term nursing facility.  Sister does not feel like she can continue to monitor him as needed.  Patient is becoming very depressed and not willing to do much of anything for himself.  He has no desire to make himself food or even take his medication.  She feels like he would benefit from socialization as well as someone to confirm he is taking his medication.  Patient is being monitored by pulmonology, cardiology, psychiatry.  .. Active Ambulatory Problems    Diagnosis Date Noted  . Schizoaffective disorder, depressive type (HCC) 10/12/2015  . Swelling of limb 10/14/2015  . Asthma, mild persistent 10/14/2015  . Snoring 10/14/2015  . Obesity 10/14/2015  . Sleeping difficulties 10/14/2015  . Coronary artery disease involving native coronary artery of native heart without angina pectoris 10/14/2015  . Dyspnea 10/17/2015  . Severe obstructive sleep apnea 11/14/2015  . Acquired buried penis 03/27/2016  . Chronic asthma 03/27/2016  . Hypoxemia 03/27/2016  . Cough 03/27/2016  . Pulmonary nodules/lesions, multiple 03/27/2016  . Severe persistent asthma with acute exacerbation 06/12/2016  . Chronic combined systolic and diastolic heart failure (HCC) 07/06/2016  . Centrilobular emphysema (HCC)   . Abnormal cardiac CT angiography 08/27/2016  . Restrictive lung disease 08/27/2016  . Hyperlipidemia with target low density lipoprotein (LDL) cholesterol less than 70 mg/dL 40/98/1191  . CAD S/P percutaneous coronary angioplasty Dec 31, 202018  . PAH (pulmonary artery hypertension) (HCC) 09/01/2016  . Schizophrenia (HCC) 02/25/2017  . Bipolar affective (HCC) 02/25/2017  . HFrEF (heart failure with reduced ejection fraction) (HCC) 02/25/2017    Resolved Ambulatory Problems    Diagnosis Date Noted  . Acute abdominal pain in left lower quadrant 11/22/2015   Past Medical History:  Diagnosis Date  . Anxiety   . Bipolar affective (HCC)   . CAD S/P percutaneous coronary angioplasty   . Centrilobular emphysema (HCC)   . 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 COPD (chronic obstructive pulmonary disease) (HCC), home O2 at 2 lpm   . Depression   . GERD (gastroesophageal reflux disease)   . HFrEF (heart failure with reduced ejection fraction) (HCC)   . High cholesterol   . History of hiatal hernia   . History of stomach ulcers   . Hypertension   . Ischemic cardiomyopathy   . OSA on CPAP   . PAH (pulmonary artery hypertension) (HCC)   . Persistent asthma   . Pneumonia   . Schizophrenia (HCC)   . Thoracic aortic atherosclerosis (HCC)      Review of Systems  All other systems reviewed and are negative.      Objective:   Physical Exam  Constitutional: He is oriented to person, place, and time. He appears well-developed and well-nourished.  Obese.   HENT:  Head: Normocephalic and atraumatic.  Cardiovascular: Normal rate, regular rhythm and normal heart sounds.  Pulmonary/Chest: Effort normal and breath sounds normal. He has no wheezes.  On continuous O2 at 2L.    Neurological: He is alert and oriented to person, place, and time.  Psychiatric: His behavior is normal.  Very flat affect.           Assessment & Plan:  Marland KitchenMarland KitchenDiagnoses and all orders for this visit:  Chronic combined systolic and diastolic heart failure (HCC)  Severe obstructive sleep apnea  Centrilobular emphysema (HCC)  Paranoid schizophrenia (HCC)  Schizoaffective disorder, depressive type (HCC)  Bipolar disorder, current episode depressed, severe, with psychotic features (HCC)  Chronic systolic heart failure (HCC)  Influenza vaccine needed -     Flu Vaccine QUAD 6+ mos PF IM (Fluarix Quad PF)   I printed FL2 form and  filled out in office. Will scan info into chart.  I certainly feel like a permanent nursing facility could be a good idea.  I think it would be great for socialization as well as confirming that he is getting all of his medications.  Follow-up if you need any more paperwork filled out.

## 2017-03-08 DIAGNOSIS — G4733 Obstructive sleep apnea (adult) (pediatric): Secondary | ICD-10-CM | POA: Diagnosis not present

## 2017-03-17 DIAGNOSIS — G4733 Obstructive sleep apnea (adult) (pediatric): Secondary | ICD-10-CM | POA: Diagnosis not present

## 2017-03-25 ENCOUNTER — Other Ambulatory Visit: Payer: Self-pay | Admitting: Physician Assistant

## 2017-03-25 ENCOUNTER — Other Ambulatory Visit: Payer: Self-pay | Admitting: Nurse Practitioner

## 2017-04-05 DIAGNOSIS — G4733 Obstructive sleep apnea (adult) (pediatric): Secondary | ICD-10-CM | POA: Diagnosis not present

## 2017-04-08 ENCOUNTER — Other Ambulatory Visit: Payer: Self-pay | Admitting: Physician Assistant

## 2017-04-08 ENCOUNTER — Other Ambulatory Visit: Payer: Self-pay | Admitting: Nurse Practitioner

## 2017-04-08 NOTE — Telephone Encounter (Signed)
discontinued on 09/01/2016 by Creig HinesBerge, Christopher Ronald, NP for the following reason: Stop Taking at Discharge.  Do you want this filled

## 2017-04-08 NOTE — Telephone Encounter (Signed)
REFILL 

## 2017-04-11 ENCOUNTER — Other Ambulatory Visit: Payer: Self-pay | Admitting: Nurse Practitioner

## 2017-04-14 DIAGNOSIS — G4733 Obstructive sleep apnea (adult) (pediatric): Secondary | ICD-10-CM | POA: Diagnosis not present

## 2017-04-17 DIAGNOSIS — F250 Schizoaffective disorder, bipolar type: Secondary | ICD-10-CM | POA: Diagnosis not present

## 2017-04-24 ENCOUNTER — Other Ambulatory Visit: Payer: Self-pay | Admitting: Physician Assistant

## 2017-05-01 ENCOUNTER — Other Ambulatory Visit: Payer: Self-pay | Admitting: Cardiology

## 2017-05-01 NOTE — Telephone Encounter (Signed)
REFILL 

## 2017-05-06 DIAGNOSIS — G4733 Obstructive sleep apnea (adult) (pediatric): Secondary | ICD-10-CM | POA: Diagnosis not present

## 2017-05-07 ENCOUNTER — Other Ambulatory Visit: Payer: Self-pay | Admitting: Nurse Practitioner

## 2017-05-15 DIAGNOSIS — G4733 Obstructive sleep apnea (adult) (pediatric): Secondary | ICD-10-CM | POA: Diagnosis not present

## 2017-05-16 DIAGNOSIS — F250 Schizoaffective disorder, bipolar type: Secondary | ICD-10-CM | POA: Diagnosis not present

## 2017-05-16 DIAGNOSIS — F29 Unspecified psychosis not due to a substance or known physiological condition: Secondary | ICD-10-CM | POA: Diagnosis not present

## 2017-05-16 DIAGNOSIS — F1220 Cannabis dependence, uncomplicated: Secondary | ICD-10-CM | POA: Diagnosis not present

## 2017-05-16 DIAGNOSIS — F1210 Cannabis abuse, uncomplicated: Secondary | ICD-10-CM | POA: Diagnosis not present

## 2017-05-16 DIAGNOSIS — F209 Schizophrenia, unspecified: Secondary | ICD-10-CM | POA: Diagnosis not present

## 2017-05-16 DIAGNOSIS — F22 Delusional disorders: Secondary | ICD-10-CM | POA: Diagnosis not present

## 2017-05-17 DIAGNOSIS — F209 Schizophrenia, unspecified: Secondary | ICD-10-CM | POA: Diagnosis not present

## 2017-05-17 DIAGNOSIS — T43505A Adverse effect of unspecified antipsychotics and neuroleptics, initial encounter: Secondary | ICD-10-CM | POA: Diagnosis not present

## 2017-05-17 DIAGNOSIS — Z87891 Personal history of nicotine dependence: Secondary | ICD-10-CM | POA: Diagnosis not present

## 2017-05-17 DIAGNOSIS — I2510 Atherosclerotic heart disease of native coronary artery without angina pectoris: Secondary | ICD-10-CM | POA: Diagnosis not present

## 2017-05-17 DIAGNOSIS — F29 Unspecified psychosis not due to a substance or known physiological condition: Secondary | ICD-10-CM | POA: Diagnosis not present

## 2017-05-17 DIAGNOSIS — T434X5A Adverse effect of butyrophenone and thiothixene neuroleptics, initial encounter: Secondary | ICD-10-CM | POA: Diagnosis not present

## 2017-05-17 DIAGNOSIS — G4733 Obstructive sleep apnea (adult) (pediatric): Secondary | ICD-10-CM | POA: Diagnosis not present

## 2017-05-17 DIAGNOSIS — Z955 Presence of coronary angioplasty implant and graft: Secondary | ICD-10-CM | POA: Diagnosis not present

## 2017-05-17 DIAGNOSIS — F250 Schizoaffective disorder, bipolar type: Secondary | ICD-10-CM | POA: Diagnosis not present

## 2017-05-17 DIAGNOSIS — N390 Urinary tract infection, site not specified: Secondary | ICD-10-CM | POA: Diagnosis not present

## 2017-05-17 DIAGNOSIS — R221 Localized swelling, mass and lump, neck: Secondary | ICD-10-CM | POA: Diagnosis not present

## 2017-05-17 DIAGNOSIS — Z9114 Patient's other noncompliance with medication regimen: Secondary | ICD-10-CM | POA: Diagnosis not present

## 2017-05-17 DIAGNOSIS — F329 Major depressive disorder, single episode, unspecified: Secondary | ICD-10-CM | POA: Diagnosis not present

## 2017-05-17 DIAGNOSIS — G2401 Drug induced subacute dyskinesia: Secondary | ICD-10-CM | POA: Diagnosis not present

## 2017-05-17 DIAGNOSIS — F1210 Cannabis abuse, uncomplicated: Secondary | ICD-10-CM | POA: Diagnosis not present

## 2017-05-17 DIAGNOSIS — J449 Chronic obstructive pulmonary disease, unspecified: Secondary | ICD-10-CM | POA: Diagnosis not present

## 2017-05-17 DIAGNOSIS — F251 Schizoaffective disorder, depressive type: Secondary | ICD-10-CM | POA: Diagnosis not present

## 2017-05-17 DIAGNOSIS — I10 Essential (primary) hypertension: Secondary | ICD-10-CM | POA: Diagnosis not present

## 2017-05-17 DIAGNOSIS — B372 Candidiasis of skin and nail: Secondary | ICD-10-CM | POA: Diagnosis not present

## 2017-05-22 ENCOUNTER — Other Ambulatory Visit: Payer: Self-pay | Admitting: Physician Assistant

## 2017-05-22 DIAGNOSIS — K5900 Constipation, unspecified: Secondary | ICD-10-CM

## 2017-05-24 ENCOUNTER — Other Ambulatory Visit: Payer: Self-pay | Admitting: Cardiology

## 2017-06-05 ENCOUNTER — Other Ambulatory Visit: Payer: Self-pay | Admitting: Physician Assistant

## 2017-06-05 ENCOUNTER — Other Ambulatory Visit: Payer: Self-pay | Admitting: Cardiology

## 2017-06-05 ENCOUNTER — Other Ambulatory Visit: Payer: Self-pay | Admitting: Nurse Practitioner

## 2017-06-05 DIAGNOSIS — K5900 Constipation, unspecified: Secondary | ICD-10-CM

## 2017-06-05 NOTE — Telephone Encounter (Signed)
Rx request sent to pharmacy.  

## 2017-06-10 DIAGNOSIS — F250 Schizoaffective disorder, bipolar type: Secondary | ICD-10-CM | POA: Diagnosis not present

## 2017-06-11 ENCOUNTER — Other Ambulatory Visit: Payer: Self-pay

## 2017-06-11 NOTE — Patient Outreach (Signed)
[facility] Customer service manager [facility]) Care Management  06/11/2017  JUSITN SALSGIVER 21-Jul-1945 [id number]   Referral received. No outreach warranted at this time. Transition of Care  will be completed by primary care provider office who will refer to [facility] care management if needed.  Plan: RN CM will close case.  Bary Leriche, RN, MSN [facility] Care Management Care Management Coordinator Direct Line [phone] Toll Free: [phone]  Fax: [phone]

## 2017-06-12 DIAGNOSIS — F250 Schizoaffective disorder, bipolar type: Secondary | ICD-10-CM | POA: Diagnosis not present

## 2017-06-14 DIAGNOSIS — G4733 Obstructive sleep apnea (adult) (pediatric): Secondary | ICD-10-CM | POA: Diagnosis not present

## 2017-07-05 ENCOUNTER — Other Ambulatory Visit: Payer: Self-pay | Admitting: Physician Assistant

## 2017-07-05 ENCOUNTER — Other Ambulatory Visit: Payer: Self-pay | Admitting: Cardiology

## 2017-07-05 DIAGNOSIS — K5900 Constipation, unspecified: Secondary | ICD-10-CM

## 2017-07-06 DIAGNOSIS — G4733 Obstructive sleep apnea (adult) (pediatric): Secondary | ICD-10-CM | POA: Diagnosis not present

## 2017-07-16 ENCOUNTER — Telehealth: Payer: Self-pay | Admitting: Cardiology

## 2017-07-16 NOTE — Telephone Encounter (Signed)
Received notification patient based on medication feeling history is not taking atorvastatin 80 mg as directed.  Ryan Lemmaavid Chaise Passarella, MD

## 2017-08-02 ENCOUNTER — Other Ambulatory Visit: Payer: Self-pay | Admitting: Physician Assistant

## 2017-08-05 DIAGNOSIS — G4733 Obstructive sleep apnea (adult) (pediatric): Secondary | ICD-10-CM | POA: Diagnosis not present

## 2017-08-16 DIAGNOSIS — D72829 Elevated white blood cell count, unspecified: Secondary | ICD-10-CM | POA: Diagnosis not present

## 2017-08-16 DIAGNOSIS — F250 Schizoaffective disorder, bipolar type: Secondary | ICD-10-CM | POA: Diagnosis not present

## 2017-08-16 DIAGNOSIS — F200 Paranoid schizophrenia: Secondary | ICD-10-CM | POA: Diagnosis not present

## 2017-08-16 DIAGNOSIS — F205 Residual schizophrenia: Secondary | ICD-10-CM | POA: Diagnosis not present

## 2017-08-28 DIAGNOSIS — R9082 White matter disease, unspecified: Secondary | ICD-10-CM | POA: Diagnosis not present

## 2017-08-28 DIAGNOSIS — F250 Schizoaffective disorder, bipolar type: Secondary | ICD-10-CM | POA: Diagnosis not present

## 2017-08-28 DIAGNOSIS — R45851 Suicidal ideations: Secondary | ICD-10-CM | POA: Diagnosis not present

## 2017-08-28 DIAGNOSIS — R45850 Homicidal ideations: Secondary | ICD-10-CM | POA: Diagnosis not present

## 2017-08-28 DIAGNOSIS — Z23 Encounter for immunization: Secondary | ICD-10-CM | POA: Diagnosis not present

## 2017-08-29 DIAGNOSIS — R0902 Hypoxemia: Secondary | ICD-10-CM | POA: Diagnosis not present

## 2017-08-30 DIAGNOSIS — Z9981 Dependence on supplemental oxygen: Secondary | ICD-10-CM | POA: Diagnosis not present

## 2017-08-30 DIAGNOSIS — R42 Dizziness and giddiness: Secondary | ICD-10-CM | POA: Diagnosis not present

## 2017-08-30 DIAGNOSIS — G4733 Obstructive sleep apnea (adult) (pediatric): Secondary | ICD-10-CM | POA: Diagnosis not present

## 2017-08-30 DIAGNOSIS — F259 Schizoaffective disorder, unspecified: Secondary | ICD-10-CM | POA: Diagnosis not present

## 2017-08-30 DIAGNOSIS — R319 Hematuria, unspecified: Secondary | ICD-10-CM | POA: Diagnosis not present

## 2017-08-30 DIAGNOSIS — I5022 Chronic systolic (congestive) heart failure: Secondary | ICD-10-CM | POA: Diagnosis not present

## 2017-08-30 DIAGNOSIS — Z955 Presence of coronary angioplasty implant and graft: Secondary | ICD-10-CM | POA: Diagnosis not present

## 2017-08-30 DIAGNOSIS — R9431 Abnormal electrocardiogram [ECG] [EKG]: Secondary | ICD-10-CM | POA: Diagnosis not present

## 2017-08-30 DIAGNOSIS — J432 Centrilobular emphysema: Secondary | ICD-10-CM | POA: Diagnosis not present

## 2017-08-30 DIAGNOSIS — I2510 Atherosclerotic heart disease of native coronary artery without angina pectoris: Secondary | ICD-10-CM | POA: Diagnosis not present

## 2017-08-30 DIAGNOSIS — L409 Psoriasis, unspecified: Secondary | ICD-10-CM | POA: Diagnosis not present

## 2017-08-30 DIAGNOSIS — S0990XA Unspecified injury of head, initial encounter: Secondary | ICD-10-CM | POA: Diagnosis not present

## 2017-08-30 DIAGNOSIS — Z9114 Patient's other noncompliance with medication regimen: Secondary | ICD-10-CM | POA: Diagnosis not present

## 2017-08-30 DIAGNOSIS — E782 Mixed hyperlipidemia: Secondary | ICD-10-CM | POA: Diagnosis not present

## 2017-08-30 DIAGNOSIS — E7800 Pure hypercholesterolemia, unspecified: Secondary | ICD-10-CM | POA: Diagnosis not present

## 2017-08-30 DIAGNOSIS — I110 Hypertensive heart disease with heart failure: Secondary | ICD-10-CM | POA: Diagnosis not present

## 2017-08-30 DIAGNOSIS — R45851 Suicidal ideations: Secondary | ICD-10-CM | POA: Diagnosis not present

## 2017-08-30 DIAGNOSIS — Z915 Personal history of self-harm: Secondary | ICD-10-CM | POA: Diagnosis not present

## 2017-08-30 DIAGNOSIS — R339 Retention of urine, unspecified: Secondary | ICD-10-CM | POA: Diagnosis not present

## 2017-08-30 DIAGNOSIS — K5901 Slow transit constipation: Secondary | ICD-10-CM | POA: Diagnosis not present

## 2017-08-30 DIAGNOSIS — I10 Essential (primary) hypertension: Secondary | ICD-10-CM | POA: Diagnosis not present

## 2017-08-30 DIAGNOSIS — N390 Urinary tract infection, site not specified: Secondary | ICD-10-CM | POA: Diagnosis not present

## 2017-08-30 DIAGNOSIS — Z818 Family history of other mental and behavioral disorders: Secondary | ICD-10-CM | POA: Diagnosis not present

## 2017-09-05 ENCOUNTER — Other Ambulatory Visit: Payer: Self-pay

## 2017-09-05 NOTE — Patient Outreach (Signed)
[facility] Care Management  09/05/2017  Salvadore Farbererry N Paladino 04/24/1945 [id number]   Referral received. No outreach warranted at this time. Transition of Care  will be completed by primary care provider office who [facility].  Plan: RN CM will close case.  Bary Lericheionne J Jencarlo Bonadonna, RN, MSN [facility] Care [facility] Care Management Coordinator Direct Line [phone] Cell [phone] Toll Free: [phone]  Fax: [phone]

## 2017-09-20 DIAGNOSIS — F259 Schizoaffective disorder, unspecified: Secondary | ICD-10-CM | POA: Diagnosis not present

## 2017-10-21 DIAGNOSIS — F259 Schizoaffective disorder, unspecified: Secondary | ICD-10-CM | POA: Diagnosis not present

## 2017-10-22 ENCOUNTER — Other Ambulatory Visit: Payer: Self-pay | Admitting: Physician Assistant

## 2017-10-22 DIAGNOSIS — F259 Schizoaffective disorder, unspecified: Secondary | ICD-10-CM | POA: Diagnosis not present

## 2017-10-22 DIAGNOSIS — J441 Chronic obstructive pulmonary disease with (acute) exacerbation: Secondary | ICD-10-CM

## 2017-10-23 DIAGNOSIS — I259 Chronic ischemic heart disease, unspecified: Secondary | ICD-10-CM | POA: Diagnosis not present

## 2017-10-23 DIAGNOSIS — M1990 Unspecified osteoarthritis, unspecified site: Secondary | ICD-10-CM | POA: Diagnosis not present

## 2017-10-23 DIAGNOSIS — F419 Anxiety disorder, unspecified: Secondary | ICD-10-CM | POA: Diagnosis not present

## 2017-11-05 DIAGNOSIS — G4733 Obstructive sleep apnea (adult) (pediatric): Secondary | ICD-10-CM | POA: Diagnosis not present

## 2017-11-07 ENCOUNTER — Other Ambulatory Visit: Payer: Self-pay | Admitting: *Deleted

## 2017-11-07 NOTE — Patient Outreach (Signed)
HTA High Risk Referral from [facility]. I have called pt's daughter/caregiver, Woody Seller, and left a message for her to return my call.  I see in the Epic chart that pt and daughter were seeking LTC in February and the primary care provider did fill out an FL2 form.  Zara Council. Burgess Estelle, MSN, [facility] Gerontological Nurse Practitioner [facility] Care Management [phone]

## 2017-11-25 DIAGNOSIS — J449 Chronic obstructive pulmonary disease, unspecified: Secondary | ICD-10-CM | POA: Diagnosis not present

## 2017-11-25 DIAGNOSIS — R002 Palpitations: Secondary | ICD-10-CM | POA: Diagnosis not present

## 2017-11-25 DIAGNOSIS — I509 Heart failure, unspecified: Secondary | ICD-10-CM | POA: Diagnosis not present

## 2017-12-04 DIAGNOSIS — Z79899 Other long term (current) drug therapy: Secondary | ICD-10-CM | POA: Diagnosis not present

## 2017-12-06 DIAGNOSIS — G4733 Obstructive sleep apnea (adult) (pediatric): Secondary | ICD-10-CM | POA: Diagnosis not present

## 2018-02-05 DIAGNOSIS — G4733 Obstructive sleep apnea (adult) (pediatric): Secondary | ICD-10-CM | POA: Diagnosis not present

## 2018-02-20 DIAGNOSIS — Z9981 Dependence on supplemental oxygen: Secondary | ICD-10-CM | POA: Diagnosis not present

## 2018-02-20 DIAGNOSIS — Z9181 History of falling: Secondary | ICD-10-CM | POA: Diagnosis not present

## 2018-02-20 DIAGNOSIS — I110 Hypertensive heart disease with heart failure: Secondary | ICD-10-CM | POA: Diagnosis not present

## 2018-02-20 DIAGNOSIS — Z7982 Long term (current) use of aspirin: Secondary | ICD-10-CM | POA: Diagnosis not present

## 2018-02-20 DIAGNOSIS — M1991 Primary osteoarthritis, unspecified site: Secondary | ICD-10-CM | POA: Diagnosis not present

## 2018-02-20 DIAGNOSIS — I509 Heart failure, unspecified: Secondary | ICD-10-CM | POA: Diagnosis not present

## 2018-02-20 DIAGNOSIS — R569 Unspecified convulsions: Secondary | ICD-10-CM | POA: Diagnosis not present

## 2018-02-20 DIAGNOSIS — Z7951 Long term (current) use of inhaled steroids: Secondary | ICD-10-CM | POA: Diagnosis not present

## 2018-02-20 DIAGNOSIS — J45909 Unspecified asthma, uncomplicated: Secondary | ICD-10-CM | POA: Diagnosis not present

## 2018-02-21 DIAGNOSIS — Z0000 Encounter for general adult medical examination without abnormal findings: Secondary | ICD-10-CM | POA: Diagnosis not present

## 2018-03-04 IMAGING — DX DG CHEST 2V
2 series · 2 of 2 positions shown · non-contrast
Comparison: March 31, 2016

CLINICAL DATA: Shortness of breath.

EXAM:
CHEST  2 VIEW

[chest pa]
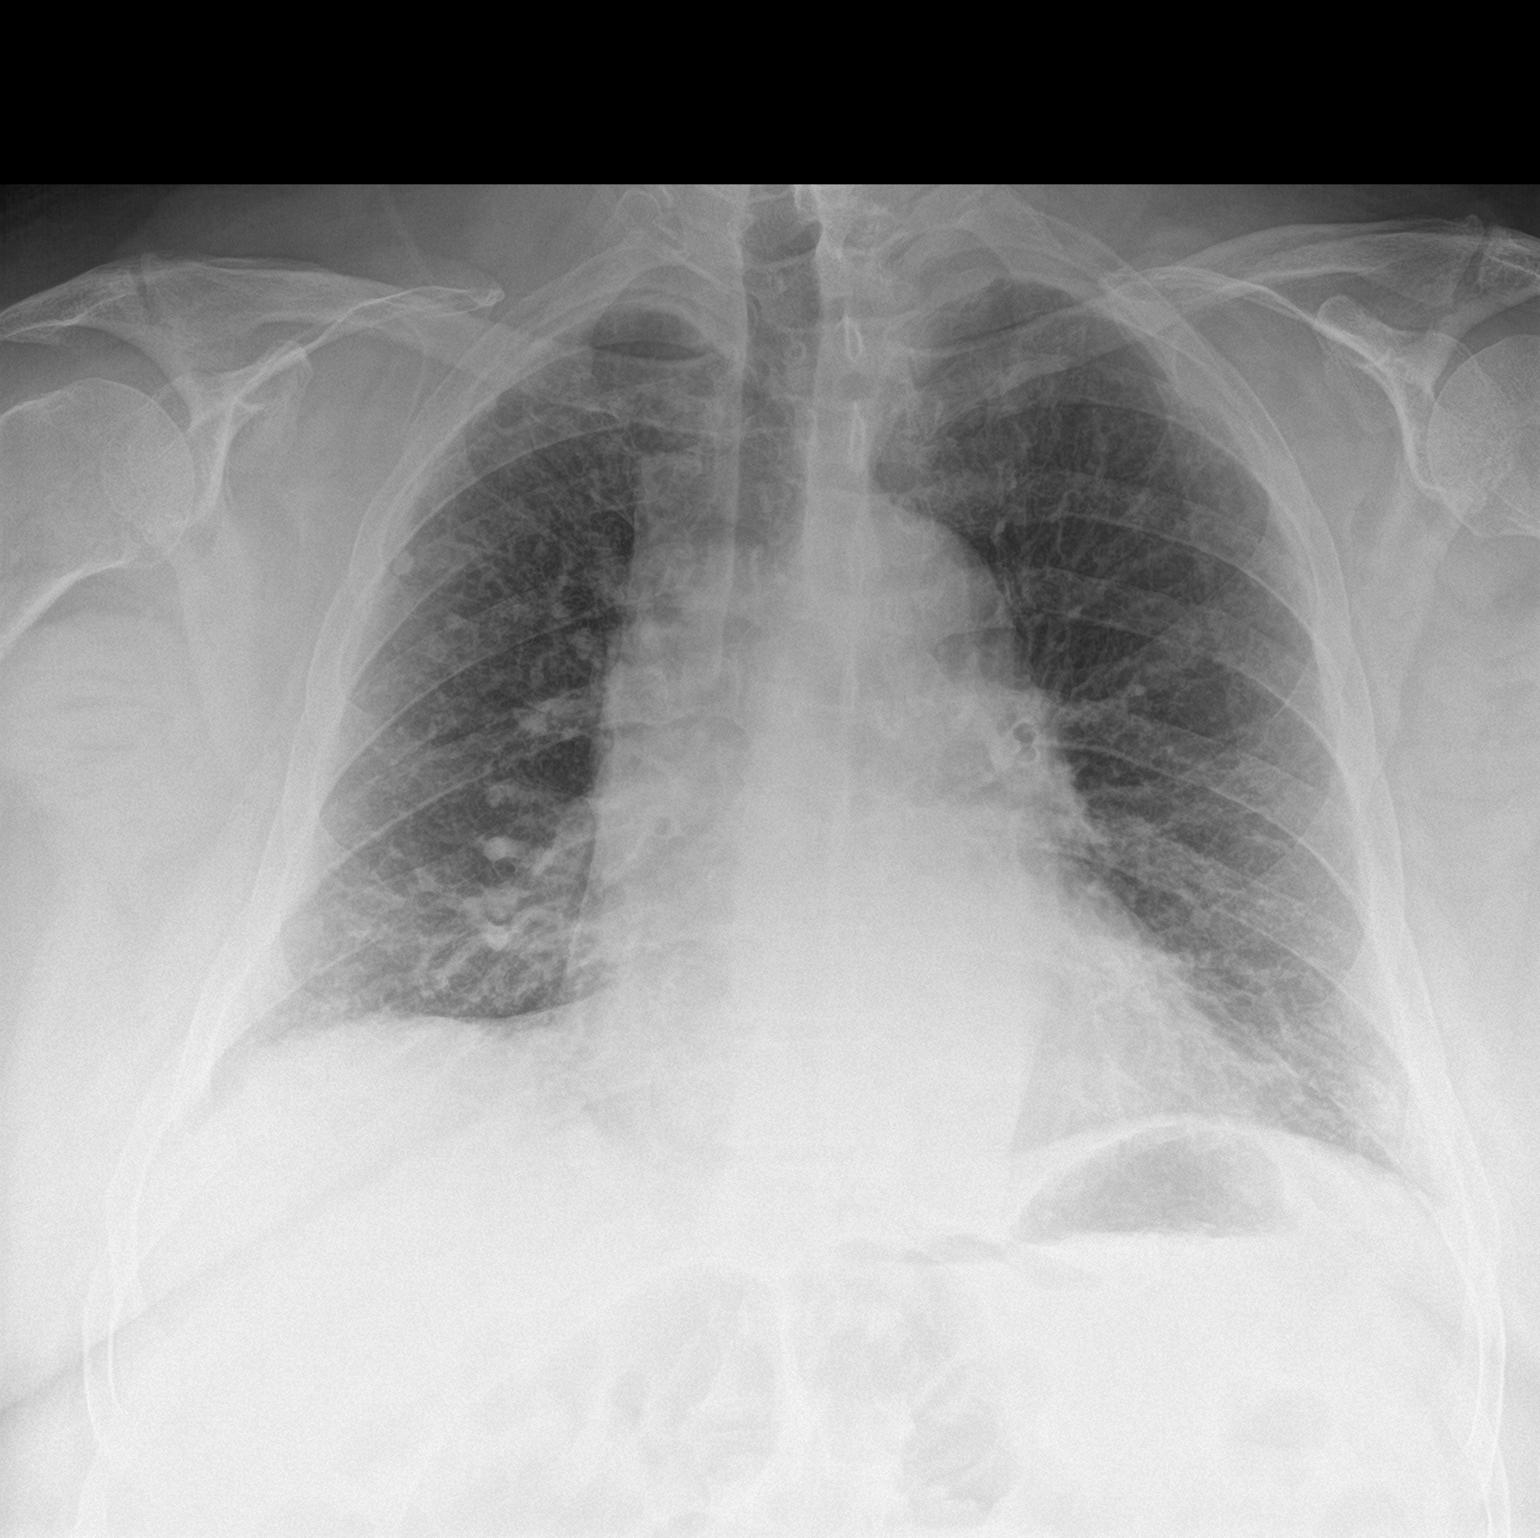

[chest lat]
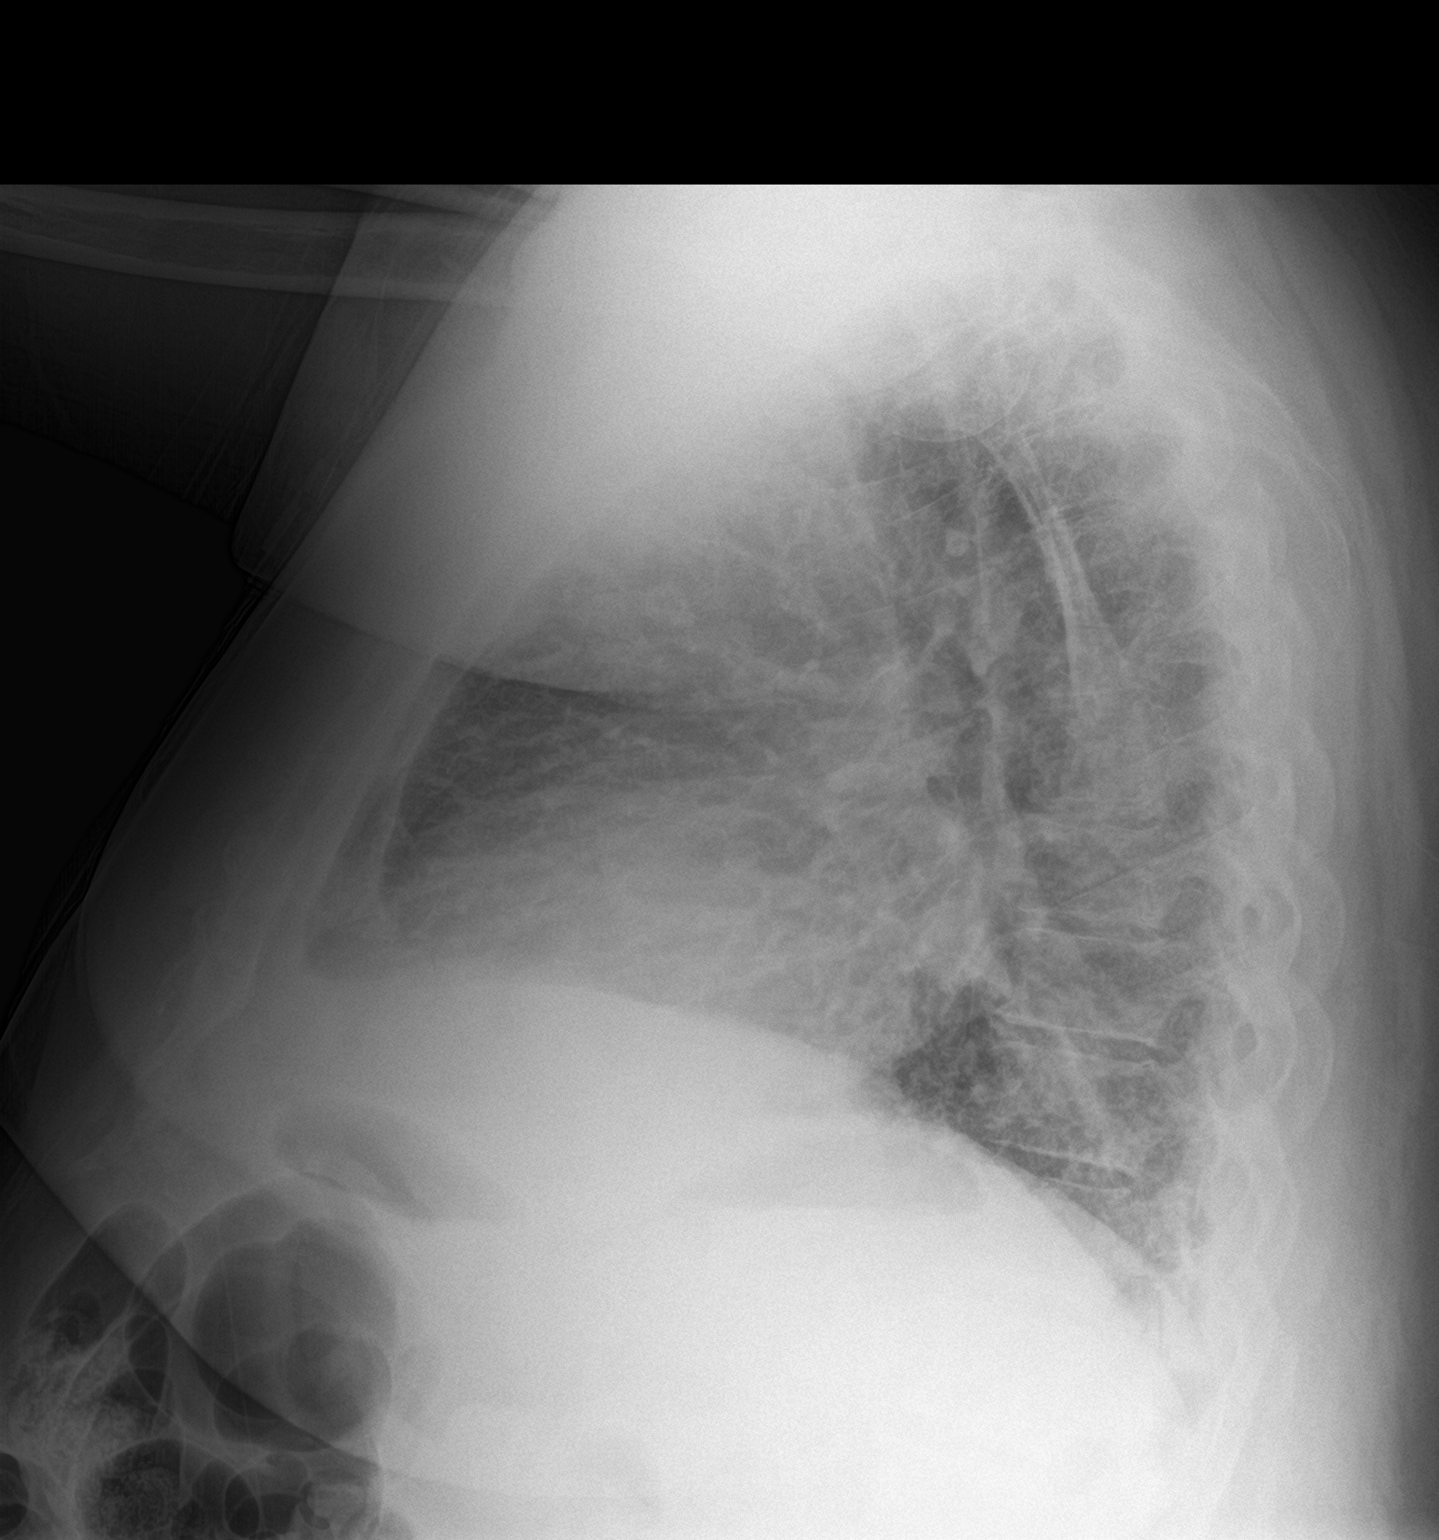

[2 of 2 positions shown; findings below may reference images not displayed]

FINDINGS: Two small nodules in the lateral right lung noted to be calcified
granulomas on the CT scan from March 28, 2016. Cardiomegaly. The hila
and mediastinum are unchanged. Probable mild edema.
IMPRESSION: Cardiomegaly and mild edema.

## 2018-03-09 IMAGING — DX DG CHEST 2V
2 series · 2 of 2 positions shown · non-contrast
Comparison: 06/03/2016 and prior chest radiographs

CLINICAL DATA: Shortness of breath for 1-1/2 weeks.

EXAM:
CHEST  2 VIEW

[chest pa]
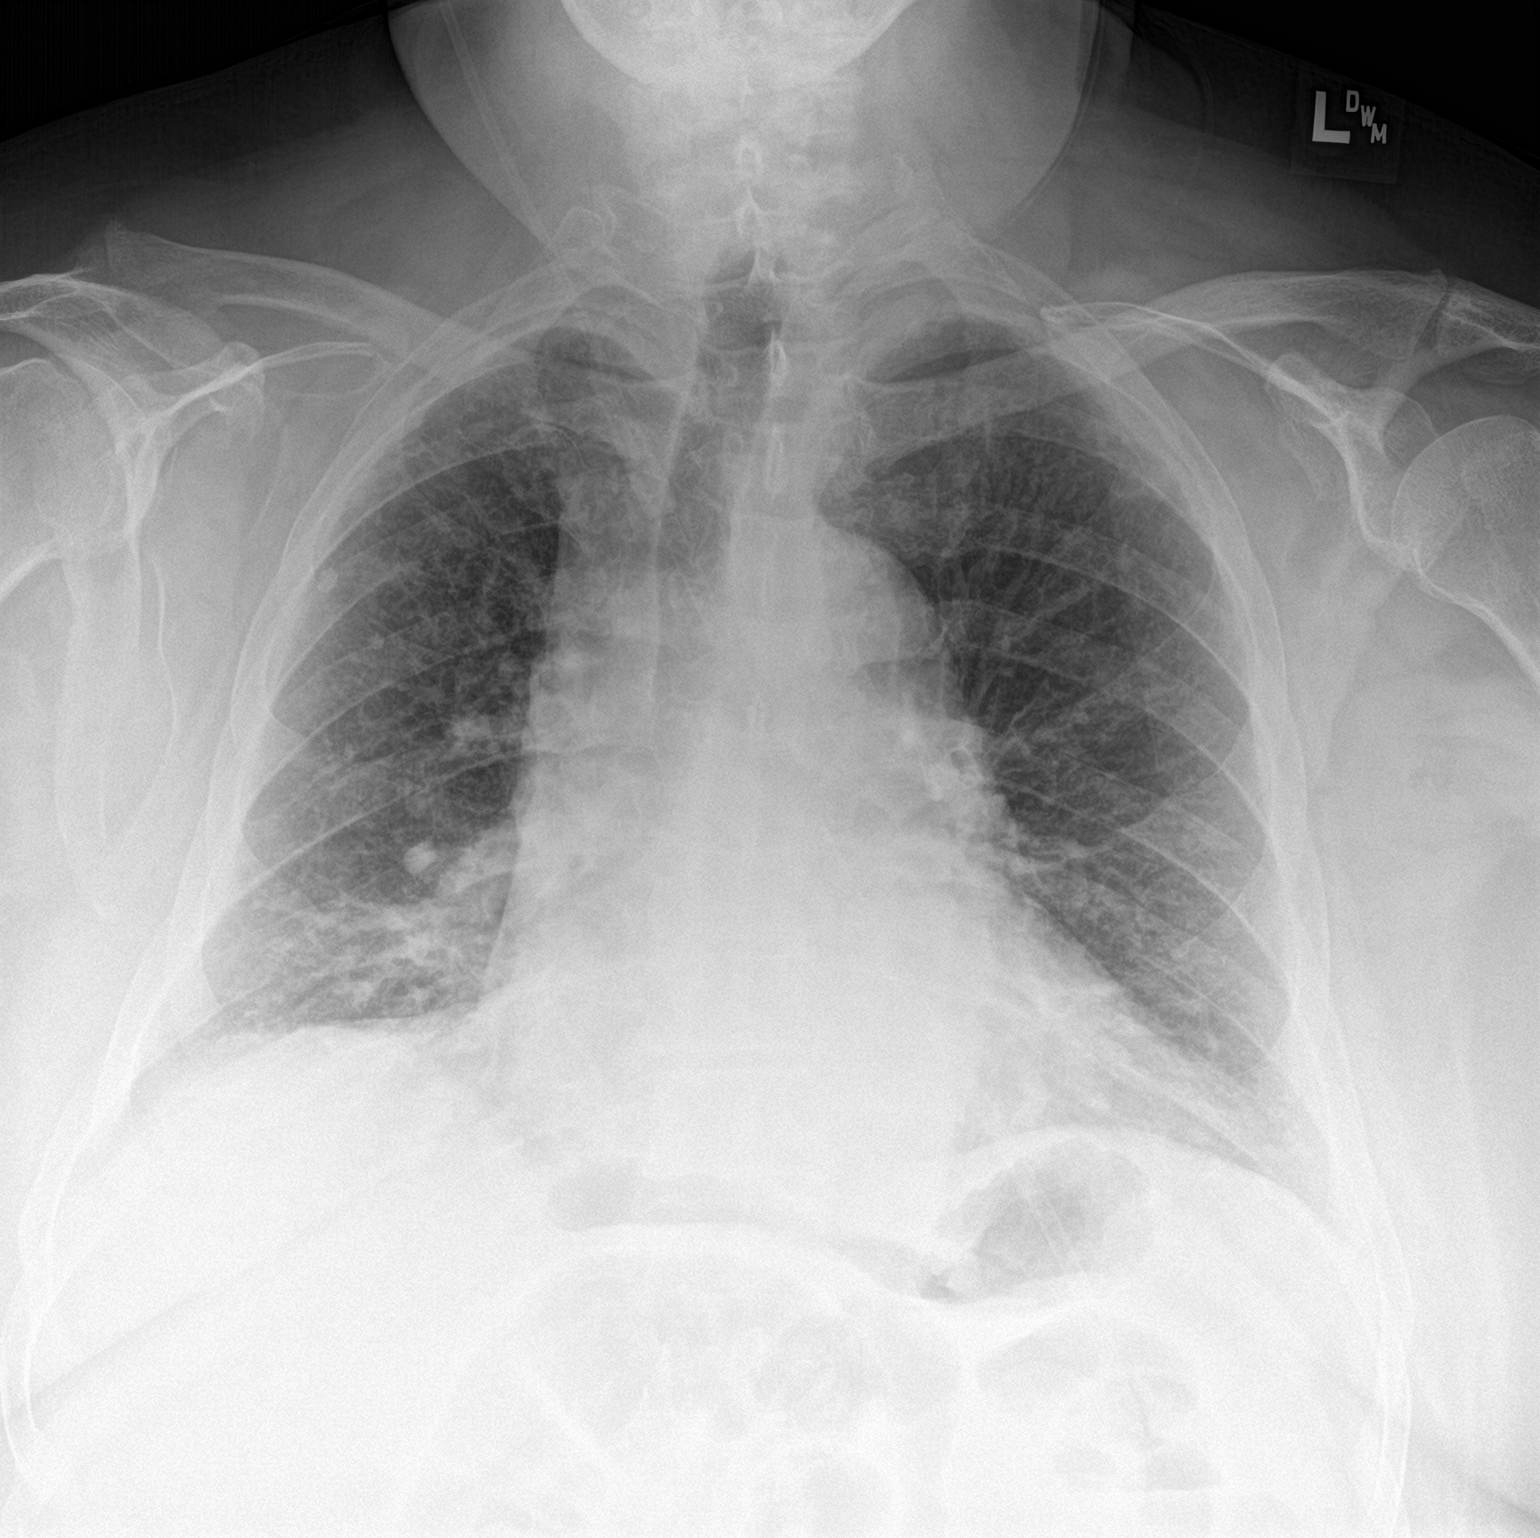

[chest lat]
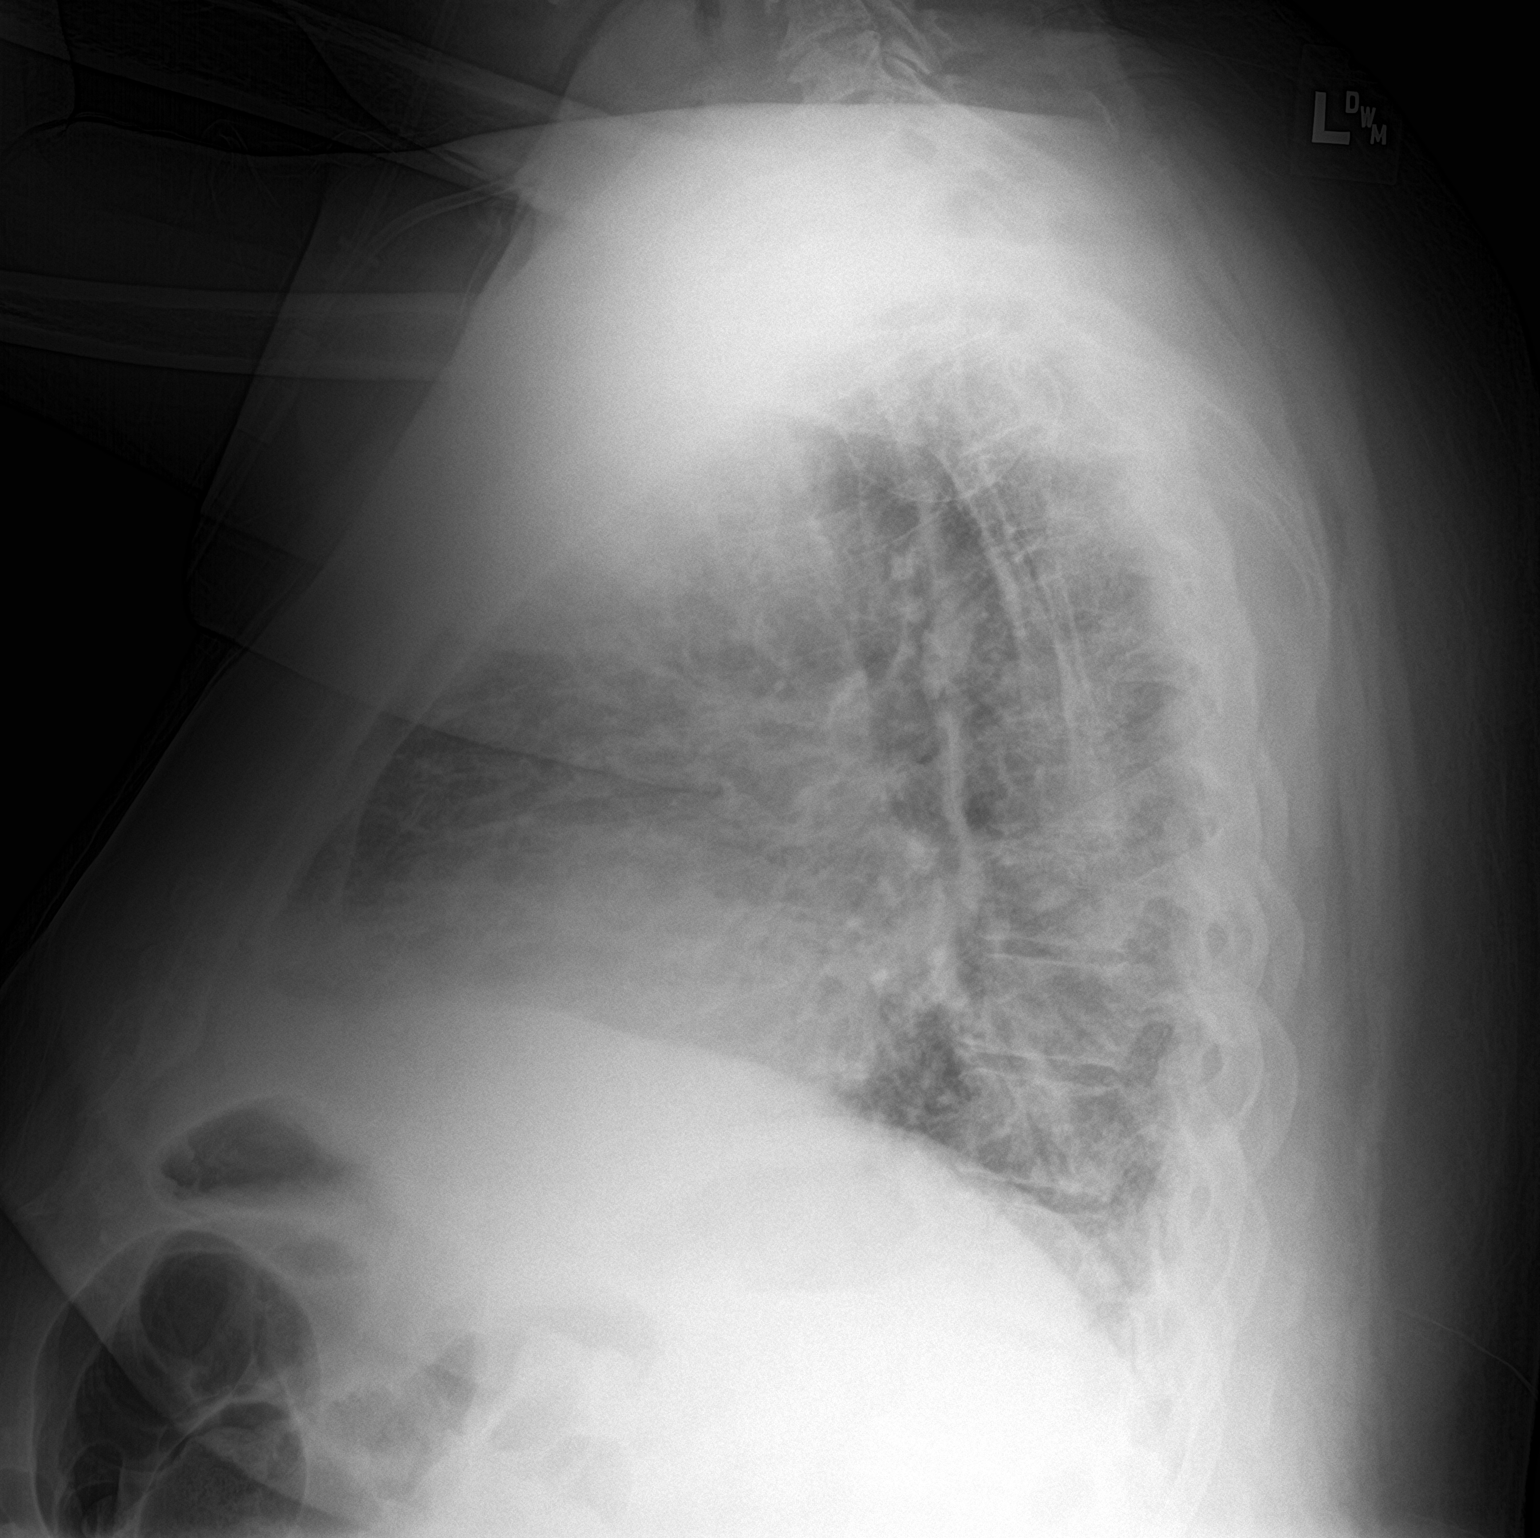

[2 of 2 positions shown; findings below may reference images not displayed]

FINDINGS: Cardiomegaly and mild interstitial opacities again noted.

Elevation of the right hemidiaphragm and right basilar scarring
again noted.

There is no evidence of focal airspace disease, suspicious pulmonary
nodule/mass, pleural effusion, or pneumothorax. No acute bony
abnormalities are identified.
IMPRESSION: Cardiomegaly without evidence of acute cardiopulmonary disease.

Unchanged interstitial opacities.

## 2018-09-23 DEATH — deceased
